# Patient Record
Sex: Female | Born: 1950
Health system: Southern US, Community
[De-identification: ages and names within clinical notes are randomized; demographics above are authoritative.]

## PROBLEM LIST (undated history)

## (undated) DIAGNOSIS — G43909 Migraine, unspecified, not intractable, without status migrainosus: Secondary | ICD-10-CM

## (undated) DIAGNOSIS — J449 Chronic obstructive pulmonary disease, unspecified: Secondary | ICD-10-CM

## (undated) DIAGNOSIS — D329 Benign neoplasm of meninges, unspecified: Secondary | ICD-10-CM

## (undated) DIAGNOSIS — K219 Gastro-esophageal reflux disease without esophagitis: Secondary | ICD-10-CM

## (undated) DIAGNOSIS — G459 Transient cerebral ischemic attack, unspecified: Secondary | ICD-10-CM

## (undated) DIAGNOSIS — M810 Age-related osteoporosis without current pathological fracture: Secondary | ICD-10-CM

## (undated) DIAGNOSIS — E119 Type 2 diabetes mellitus without complications: Secondary | ICD-10-CM

## (undated) DIAGNOSIS — I214 Non-ST elevation (NSTEMI) myocardial infarction: Secondary | ICD-10-CM

## (undated) DIAGNOSIS — I4891 Unspecified atrial fibrillation: Secondary | ICD-10-CM

## (undated) DIAGNOSIS — E785 Hyperlipidemia, unspecified: Secondary | ICD-10-CM

## (undated) DIAGNOSIS — F32A Depression, unspecified: Secondary | ICD-10-CM

## (undated) DIAGNOSIS — F418 Other specified anxiety disorders: Secondary | ICD-10-CM

## (undated) DIAGNOSIS — I219 Acute myocardial infarction, unspecified: Secondary | ICD-10-CM

## (undated) DIAGNOSIS — I2510 Atherosclerotic heart disease of native coronary artery without angina pectoris: Secondary | ICD-10-CM

## (undated) DIAGNOSIS — I10 Essential (primary) hypertension: Secondary | ICD-10-CM

## (undated) DIAGNOSIS — J45909 Unspecified asthma, uncomplicated: Secondary | ICD-10-CM

## (undated) DIAGNOSIS — F329 Major depressive disorder, single episode, unspecified: Secondary | ICD-10-CM

## (undated) DIAGNOSIS — I639 Cerebral infarction, unspecified: Secondary | ICD-10-CM

## (undated) HISTORY — DX: Other specified anxiety disorders: F41.8

## (undated) HISTORY — DX: Essential (primary) hypertension: I10

## (undated) HISTORY — DX: Gastro-esophageal reflux disease without esophagitis: K21.9

## (undated) HISTORY — DX: Atherosclerotic heart disease of native coronary artery without angina pectoris: I25.10

## (undated) HISTORY — DX: Transient cerebral ischemic attack, unspecified: G45.9

## (undated) HISTORY — PX: ABDOMINAL HYSTERECTOMY: SHX81

## (undated) HISTORY — DX: Chronic obstructive pulmonary disease, unspecified: J44.9

## (undated) HISTORY — DX: Unspecified asthma, uncomplicated: J45.909

## (undated) HISTORY — DX: Age-related osteoporosis without current pathological fracture: M81.0

## (undated) HISTORY — PX: CHOLECYSTECTOMY: SHX55

## (undated) HISTORY — DX: Cerebral infarction, unspecified: I63.9

## (undated) HISTORY — DX: Unspecified atrial fibrillation: I48.91

## (undated) HISTORY — DX: Benign neoplasm of meninges, unspecified: D32.9

## (undated) HISTORY — DX: Hyperlipidemia, unspecified: E78.5

## (undated) HISTORY — DX: Depression, unspecified: F32.A

## (undated) HISTORY — DX: Migraine, unspecified, not intractable, without status migrainosus: G43.909

## (undated) HISTORY — DX: Type 2 diabetes mellitus without complications: E11.9

## (undated) HISTORY — DX: Major depressive disorder, single episode, unspecified: F32.9

## (undated) HISTORY — DX: Non-ST elevation (NSTEMI) myocardial infarction: I21.4

## (undated) HISTORY — DX: Acute myocardial infarction, unspecified: I21.9

---

## 2013-10-08 DIAGNOSIS — R55 Syncope and collapse: Secondary | ICD-10-CM | POA: Insufficient documentation

## 2013-11-20 DIAGNOSIS — E785 Hyperlipidemia, unspecified: Secondary | ICD-10-CM | POA: Insufficient documentation

## 2013-11-20 DIAGNOSIS — J449 Chronic obstructive pulmonary disease, unspecified: Secondary | ICD-10-CM | POA: Insufficient documentation

## 2013-11-20 DIAGNOSIS — M1990 Unspecified osteoarthritis, unspecified site: Secondary | ICD-10-CM | POA: Insufficient documentation

## 2013-11-20 DIAGNOSIS — I639 Cerebral infarction, unspecified: Secondary | ICD-10-CM | POA: Insufficient documentation

## 2013-11-20 DIAGNOSIS — I10 Essential (primary) hypertension: Secondary | ICD-10-CM | POA: Insufficient documentation

## 2013-11-24 DIAGNOSIS — D320 Benign neoplasm of cerebral meninges: Secondary | ICD-10-CM | POA: Insufficient documentation

## 2014-10-01 DIAGNOSIS — D320 Benign neoplasm of cerebral meninges: Secondary | ICD-10-CM | POA: Diagnosis not present

## 2014-10-01 DIAGNOSIS — R51 Headache: Secondary | ICD-10-CM | POA: Diagnosis not present

## 2014-12-24 DIAGNOSIS — N201 Calculus of ureter: Secondary | ICD-10-CM | POA: Diagnosis not present

## 2014-12-24 DIAGNOSIS — N390 Urinary tract infection, site not specified: Secondary | ICD-10-CM | POA: Diagnosis not present

## 2014-12-24 DIAGNOSIS — N132 Hydronephrosis with renal and ureteral calculous obstruction: Secondary | ICD-10-CM | POA: Diagnosis not present

## 2015-02-15 DIAGNOSIS — I10 Essential (primary) hypertension: Secondary | ICD-10-CM | POA: Diagnosis not present

## 2015-02-15 DIAGNOSIS — J449 Chronic obstructive pulmonary disease, unspecified: Secondary | ICD-10-CM | POA: Diagnosis not present

## 2015-02-15 DIAGNOSIS — E1169 Type 2 diabetes mellitus with other specified complication: Secondary | ICD-10-CM | POA: Diagnosis not present

## 2015-02-15 DIAGNOSIS — Z955 Presence of coronary angioplasty implant and graft: Secondary | ICD-10-CM | POA: Diagnosis not present

## 2015-03-01 DIAGNOSIS — E1165 Type 2 diabetes mellitus with hyperglycemia: Secondary | ICD-10-CM | POA: Diagnosis not present

## 2015-03-01 DIAGNOSIS — Z79899 Other long term (current) drug therapy: Secondary | ICD-10-CM | POA: Diagnosis not present

## 2015-03-01 DIAGNOSIS — I10 Essential (primary) hypertension: Secondary | ICD-10-CM | POA: Diagnosis not present

## 2015-03-01 DIAGNOSIS — E559 Vitamin D deficiency, unspecified: Secondary | ICD-10-CM | POA: Diagnosis not present

## 2015-03-01 DIAGNOSIS — J449 Chronic obstructive pulmonary disease, unspecified: Secondary | ICD-10-CM | POA: Diagnosis not present

## 2015-03-01 DIAGNOSIS — E785 Hyperlipidemia, unspecified: Secondary | ICD-10-CM | POA: Diagnosis not present

## 2015-04-07 DIAGNOSIS — E1165 Type 2 diabetes mellitus with hyperglycemia: Secondary | ICD-10-CM | POA: Diagnosis not present

## 2015-04-07 DIAGNOSIS — Z6830 Body mass index (BMI) 30.0-30.9, adult: Secondary | ICD-10-CM | POA: Diagnosis not present

## 2015-05-04 DIAGNOSIS — H2513 Age-related nuclear cataract, bilateral: Secondary | ICD-10-CM | POA: Diagnosis not present

## 2015-05-04 DIAGNOSIS — E119 Type 2 diabetes mellitus without complications: Secondary | ICD-10-CM | POA: Diagnosis not present

## 2015-05-09 DIAGNOSIS — K219 Gastro-esophageal reflux disease without esophagitis: Secondary | ICD-10-CM | POA: Diagnosis not present

## 2015-05-09 DIAGNOSIS — J449 Chronic obstructive pulmonary disease, unspecified: Secondary | ICD-10-CM | POA: Diagnosis not present

## 2015-05-09 DIAGNOSIS — I2510 Atherosclerotic heart disease of native coronary artery without angina pectoris: Secondary | ICD-10-CM | POA: Diagnosis not present

## 2015-05-09 DIAGNOSIS — Z79899 Other long term (current) drug therapy: Secondary | ICD-10-CM | POA: Diagnosis not present

## 2015-05-09 DIAGNOSIS — I509 Heart failure, unspecified: Secondary | ICD-10-CM | POA: Diagnosis not present

## 2015-05-09 DIAGNOSIS — R404 Transient alteration of awareness: Secondary | ICD-10-CM | POA: Diagnosis not present

## 2015-05-09 DIAGNOSIS — Z794 Long term (current) use of insulin: Secondary | ICD-10-CM | POA: Diagnosis not present

## 2015-05-09 DIAGNOSIS — E7800 Pure hypercholesterolemia, unspecified: Secondary | ICD-10-CM | POA: Diagnosis not present

## 2015-05-09 DIAGNOSIS — Z7982 Long term (current) use of aspirin: Secondary | ICD-10-CM | POA: Diagnosis not present

## 2015-05-09 DIAGNOSIS — E119 Type 2 diabetes mellitus without complications: Secondary | ICD-10-CM | POA: Diagnosis not present

## 2015-05-09 DIAGNOSIS — J45909 Unspecified asthma, uncomplicated: Secondary | ICD-10-CM | POA: Diagnosis not present

## 2015-05-09 DIAGNOSIS — R42 Dizziness and giddiness: Secondary | ICD-10-CM | POA: Diagnosis not present

## 2015-05-09 DIAGNOSIS — R531 Weakness: Secondary | ICD-10-CM | POA: Diagnosis not present

## 2015-05-09 DIAGNOSIS — R55 Syncope and collapse: Secondary | ICD-10-CM | POA: Diagnosis not present

## 2015-05-09 DIAGNOSIS — I10 Essential (primary) hypertension: Secondary | ICD-10-CM | POA: Diagnosis not present

## 2015-05-09 DIAGNOSIS — Z8673 Personal history of transient ischemic attack (TIA), and cerebral infarction without residual deficits: Secondary | ICD-10-CM | POA: Diagnosis not present

## 2015-05-26 DIAGNOSIS — E663 Overweight: Secondary | ICD-10-CM | POA: Diagnosis not present

## 2015-05-26 DIAGNOSIS — E1165 Type 2 diabetes mellitus with hyperglycemia: Secondary | ICD-10-CM | POA: Diagnosis not present

## 2015-05-26 DIAGNOSIS — Z6830 Body mass index (BMI) 30.0-30.9, adult: Secondary | ICD-10-CM | POA: Diagnosis not present

## 2015-06-10 DIAGNOSIS — S61210A Laceration without foreign body of right index finger without damage to nail, initial encounter: Secondary | ICD-10-CM | POA: Diagnosis not present

## 2015-06-16 DIAGNOSIS — R112 Nausea with vomiting, unspecified: Secondary | ICD-10-CM | POA: Diagnosis not present

## 2015-06-16 DIAGNOSIS — E782 Mixed hyperlipidemia: Secondary | ICD-10-CM | POA: Diagnosis not present

## 2015-06-16 DIAGNOSIS — Z959 Presence of cardiac and vascular implant and graft, unspecified: Secondary | ICD-10-CM | POA: Diagnosis not present

## 2015-06-16 DIAGNOSIS — I2510 Atherosclerotic heart disease of native coronary artery without angina pectoris: Secondary | ICD-10-CM | POA: Diagnosis not present

## 2015-06-16 DIAGNOSIS — I252 Old myocardial infarction: Secondary | ICD-10-CM | POA: Diagnosis not present

## 2015-06-18 DIAGNOSIS — S61219A Laceration without foreign body of unspecified finger without damage to nail, initial encounter: Secondary | ICD-10-CM | POA: Diagnosis not present

## 2015-06-18 DIAGNOSIS — Z1389 Encounter for screening for other disorder: Secondary | ICD-10-CM | POA: Diagnosis not present

## 2015-06-18 DIAGNOSIS — Z6830 Body mass index (BMI) 30.0-30.9, adult: Secondary | ICD-10-CM | POA: Diagnosis not present

## 2015-06-18 DIAGNOSIS — T148 Other injury of unspecified body region: Secondary | ICD-10-CM | POA: Diagnosis not present

## 2015-06-18 DIAGNOSIS — Z23 Encounter for immunization: Secondary | ICD-10-CM | POA: Diagnosis not present

## 2015-07-07 DIAGNOSIS — J449 Chronic obstructive pulmonary disease, unspecified: Secondary | ICD-10-CM | POA: Diagnosis not present

## 2015-07-07 DIAGNOSIS — E1165 Type 2 diabetes mellitus with hyperglycemia: Secondary | ICD-10-CM | POA: Diagnosis not present

## 2015-07-07 DIAGNOSIS — I10 Essential (primary) hypertension: Secondary | ICD-10-CM | POA: Diagnosis not present

## 2015-07-07 DIAGNOSIS — E785 Hyperlipidemia, unspecified: Secondary | ICD-10-CM | POA: Diagnosis not present

## 2015-07-07 DIAGNOSIS — Z9181 History of falling: Secondary | ICD-10-CM | POA: Diagnosis not present

## 2015-08-06 DIAGNOSIS — M8589 Other specified disorders of bone density and structure, multiple sites: Secondary | ICD-10-CM | POA: Diagnosis not present

## 2015-08-06 DIAGNOSIS — Z1231 Encounter for screening mammogram for malignant neoplasm of breast: Secondary | ICD-10-CM | POA: Diagnosis not present

## 2015-08-06 DIAGNOSIS — Z780 Asymptomatic menopausal state: Secondary | ICD-10-CM | POA: Diagnosis not present

## 2015-10-05 DIAGNOSIS — J449 Chronic obstructive pulmonary disease, unspecified: Secondary | ICD-10-CM | POA: Diagnosis not present

## 2015-10-05 DIAGNOSIS — E785 Hyperlipidemia, unspecified: Secondary | ICD-10-CM | POA: Diagnosis not present

## 2015-10-05 DIAGNOSIS — I10 Essential (primary) hypertension: Secondary | ICD-10-CM | POA: Diagnosis not present

## 2015-10-05 DIAGNOSIS — E1165 Type 2 diabetes mellitus with hyperglycemia: Secondary | ICD-10-CM | POA: Diagnosis not present

## 2015-11-01 DIAGNOSIS — E1165 Type 2 diabetes mellitus with hyperglycemia: Secondary | ICD-10-CM | POA: Diagnosis not present

## 2015-11-03 DIAGNOSIS — E782 Mixed hyperlipidemia: Secondary | ICD-10-CM | POA: Diagnosis not present

## 2015-11-03 DIAGNOSIS — R1012 Left upper quadrant pain: Secondary | ICD-10-CM | POA: Diagnosis not present

## 2015-11-03 DIAGNOSIS — I2510 Atherosclerotic heart disease of native coronary artery without angina pectoris: Secondary | ICD-10-CM | POA: Diagnosis not present

## 2015-11-03 DIAGNOSIS — R0602 Shortness of breath: Secondary | ICD-10-CM | POA: Diagnosis not present

## 2015-11-03 DIAGNOSIS — R072 Precordial pain: Secondary | ICD-10-CM | POA: Diagnosis not present

## 2015-11-03 DIAGNOSIS — Z959 Presence of cardiac and vascular implant and graft, unspecified: Secondary | ICD-10-CM | POA: Diagnosis not present

## 2015-11-03 DIAGNOSIS — K2900 Acute gastritis without bleeding: Secondary | ICD-10-CM | POA: Diagnosis not present

## 2015-11-03 DIAGNOSIS — R1013 Epigastric pain: Secondary | ICD-10-CM | POA: Diagnosis not present

## 2015-11-03 DIAGNOSIS — I252 Old myocardial infarction: Secondary | ICD-10-CM | POA: Diagnosis not present

## 2015-11-11 DIAGNOSIS — Z959 Presence of cardiac and vascular implant and graft, unspecified: Secondary | ICD-10-CM | POA: Diagnosis not present

## 2015-11-11 DIAGNOSIS — I2510 Atherosclerotic heart disease of native coronary artery without angina pectoris: Secondary | ICD-10-CM | POA: Diagnosis not present

## 2015-11-11 DIAGNOSIS — R072 Precordial pain: Secondary | ICD-10-CM | POA: Diagnosis not present

## 2016-02-03 DIAGNOSIS — E785 Hyperlipidemia, unspecified: Secondary | ICD-10-CM | POA: Diagnosis not present

## 2016-02-03 DIAGNOSIS — E1165 Type 2 diabetes mellitus with hyperglycemia: Secondary | ICD-10-CM | POA: Diagnosis not present

## 2016-02-03 DIAGNOSIS — I10 Essential (primary) hypertension: Secondary | ICD-10-CM | POA: Diagnosis not present

## 2016-02-03 DIAGNOSIS — J449 Chronic obstructive pulmonary disease, unspecified: Secondary | ICD-10-CM | POA: Diagnosis not present

## 2016-03-08 DIAGNOSIS — I2510 Atherosclerotic heart disease of native coronary artery without angina pectoris: Secondary | ICD-10-CM | POA: Diagnosis not present

## 2016-03-08 DIAGNOSIS — I10 Essential (primary) hypertension: Secondary | ICD-10-CM | POA: Diagnosis not present

## 2016-03-08 DIAGNOSIS — I252 Old myocardial infarction: Secondary | ICD-10-CM | POA: Diagnosis not present

## 2016-03-08 DIAGNOSIS — J449 Chronic obstructive pulmonary disease, unspecified: Secondary | ICD-10-CM | POA: Diagnosis not present

## 2016-03-08 DIAGNOSIS — Z959 Presence of cardiac and vascular implant and graft, unspecified: Secondary | ICD-10-CM | POA: Diagnosis not present

## 2016-03-17 DIAGNOSIS — Z794 Long term (current) use of insulin: Secondary | ICD-10-CM | POA: Diagnosis not present

## 2016-03-17 DIAGNOSIS — IMO0001 Reserved for inherently not codable concepts without codable children: Secondary | ICD-10-CM | POA: Insufficient documentation

## 2016-03-17 DIAGNOSIS — E1165 Type 2 diabetes mellitus with hyperglycemia: Secondary | ICD-10-CM | POA: Diagnosis not present

## 2016-05-09 DIAGNOSIS — E119 Type 2 diabetes mellitus without complications: Secondary | ICD-10-CM | POA: Diagnosis not present

## 2016-05-09 DIAGNOSIS — H25013 Cortical age-related cataract, bilateral: Secondary | ICD-10-CM | POA: Diagnosis not present

## 2016-05-17 DIAGNOSIS — I10 Essential (primary) hypertension: Secondary | ICD-10-CM | POA: Diagnosis not present

## 2016-05-17 DIAGNOSIS — E1165 Type 2 diabetes mellitus with hyperglycemia: Secondary | ICD-10-CM | POA: Diagnosis not present

## 2016-05-17 DIAGNOSIS — Z794 Long term (current) use of insulin: Secondary | ICD-10-CM | POA: Diagnosis not present

## 2016-05-17 DIAGNOSIS — E782 Mixed hyperlipidemia: Secondary | ICD-10-CM | POA: Diagnosis not present

## 2016-06-06 DIAGNOSIS — Z23 Encounter for immunization: Secondary | ICD-10-CM | POA: Diagnosis not present

## 2016-06-06 DIAGNOSIS — I10 Essential (primary) hypertension: Secondary | ICD-10-CM | POA: Diagnosis not present

## 2016-06-06 DIAGNOSIS — E785 Hyperlipidemia, unspecified: Secondary | ICD-10-CM | POA: Diagnosis not present

## 2016-06-06 DIAGNOSIS — E1165 Type 2 diabetes mellitus with hyperglycemia: Secondary | ICD-10-CM | POA: Diagnosis not present

## 2016-06-06 DIAGNOSIS — J449 Chronic obstructive pulmonary disease, unspecified: Secondary | ICD-10-CM | POA: Diagnosis not present

## 2016-06-22 DIAGNOSIS — J449 Chronic obstructive pulmonary disease, unspecified: Secondary | ICD-10-CM | POA: Diagnosis not present

## 2016-06-22 DIAGNOSIS — I10 Essential (primary) hypertension: Secondary | ICD-10-CM | POA: Diagnosis not present

## 2016-06-22 DIAGNOSIS — I252 Old myocardial infarction: Secondary | ICD-10-CM | POA: Diagnosis not present

## 2016-06-22 DIAGNOSIS — I2510 Atherosclerotic heart disease of native coronary artery without angina pectoris: Secondary | ICD-10-CM | POA: Diagnosis not present

## 2016-06-22 DIAGNOSIS — Z959 Presence of cardiac and vascular implant and graft, unspecified: Secondary | ICD-10-CM | POA: Diagnosis not present

## 2016-08-17 DIAGNOSIS — I10 Essential (primary) hypertension: Secondary | ICD-10-CM | POA: Diagnosis not present

## 2016-08-17 DIAGNOSIS — E782 Mixed hyperlipidemia: Secondary | ICD-10-CM | POA: Diagnosis not present

## 2016-08-17 DIAGNOSIS — Z794 Long term (current) use of insulin: Secondary | ICD-10-CM | POA: Diagnosis not present

## 2016-08-17 DIAGNOSIS — E1165 Type 2 diabetes mellitus with hyperglycemia: Secondary | ICD-10-CM | POA: Diagnosis not present

## 2016-08-28 DIAGNOSIS — Z1231 Encounter for screening mammogram for malignant neoplasm of breast: Secondary | ICD-10-CM | POA: Diagnosis not present

## 2016-10-04 DIAGNOSIS — E1165 Type 2 diabetes mellitus with hyperglycemia: Secondary | ICD-10-CM | POA: Diagnosis not present

## 2016-10-04 DIAGNOSIS — E785 Hyperlipidemia, unspecified: Secondary | ICD-10-CM | POA: Diagnosis not present

## 2016-10-04 DIAGNOSIS — Z9181 History of falling: Secondary | ICD-10-CM | POA: Diagnosis not present

## 2016-10-04 DIAGNOSIS — Z1389 Encounter for screening for other disorder: Secondary | ICD-10-CM | POA: Diagnosis not present

## 2016-10-04 DIAGNOSIS — J449 Chronic obstructive pulmonary disease, unspecified: Secondary | ICD-10-CM | POA: Diagnosis not present

## 2016-10-04 DIAGNOSIS — I10 Essential (primary) hypertension: Secondary | ICD-10-CM | POA: Diagnosis not present

## 2016-11-20 DIAGNOSIS — Z794 Long term (current) use of insulin: Secondary | ICD-10-CM | POA: Diagnosis not present

## 2016-11-20 DIAGNOSIS — E1165 Type 2 diabetes mellitus with hyperglycemia: Secondary | ICD-10-CM | POA: Diagnosis not present

## 2016-11-20 DIAGNOSIS — I10 Essential (primary) hypertension: Secondary | ICD-10-CM | POA: Diagnosis not present

## 2016-11-20 DIAGNOSIS — E782 Mixed hyperlipidemia: Secondary | ICD-10-CM | POA: Diagnosis not present

## 2017-01-26 DIAGNOSIS — I2510 Atherosclerotic heart disease of native coronary artery without angina pectoris: Secondary | ICD-10-CM | POA: Diagnosis not present

## 2017-01-26 DIAGNOSIS — R0609 Other forms of dyspnea: Secondary | ICD-10-CM | POA: Diagnosis not present

## 2017-01-26 DIAGNOSIS — I252 Old myocardial infarction: Secondary | ICD-10-CM | POA: Diagnosis not present

## 2017-01-26 DIAGNOSIS — I10 Essential (primary) hypertension: Secondary | ICD-10-CM | POA: Diagnosis not present

## 2017-01-26 DIAGNOSIS — Z955 Presence of coronary angioplasty implant and graft: Secondary | ICD-10-CM | POA: Diagnosis not present

## 2017-01-31 DIAGNOSIS — I252 Old myocardial infarction: Secondary | ICD-10-CM | POA: Diagnosis not present

## 2017-01-31 DIAGNOSIS — R0609 Other forms of dyspnea: Secondary | ICD-10-CM | POA: Diagnosis not present

## 2017-02-13 DIAGNOSIS — E785 Hyperlipidemia, unspecified: Secondary | ICD-10-CM | POA: Diagnosis not present

## 2017-02-13 DIAGNOSIS — E1165 Type 2 diabetes mellitus with hyperglycemia: Secondary | ICD-10-CM | POA: Diagnosis not present

## 2017-02-13 DIAGNOSIS — I10 Essential (primary) hypertension: Secondary | ICD-10-CM | POA: Diagnosis not present

## 2017-02-13 DIAGNOSIS — Z9181 History of falling: Secondary | ICD-10-CM | POA: Diagnosis not present

## 2017-02-13 DIAGNOSIS — Z1389 Encounter for screening for other disorder: Secondary | ICD-10-CM | POA: Diagnosis not present

## 2017-02-13 DIAGNOSIS — J449 Chronic obstructive pulmonary disease, unspecified: Secondary | ICD-10-CM | POA: Diagnosis not present

## 2017-03-21 DIAGNOSIS — Z0000 Encounter for general adult medical examination without abnormal findings: Secondary | ICD-10-CM | POA: Diagnosis not present

## 2017-03-21 DIAGNOSIS — Z9181 History of falling: Secondary | ICD-10-CM | POA: Diagnosis not present

## 2017-03-21 DIAGNOSIS — Z23 Encounter for immunization: Secondary | ICD-10-CM | POA: Diagnosis not present

## 2017-03-21 DIAGNOSIS — Z1211 Encounter for screening for malignant neoplasm of colon: Secondary | ICD-10-CM | POA: Diagnosis not present

## 2017-03-21 DIAGNOSIS — E785 Hyperlipidemia, unspecified: Secondary | ICD-10-CM | POA: Diagnosis not present

## 2017-03-22 DIAGNOSIS — I2510 Atherosclerotic heart disease of native coronary artery without angina pectoris: Secondary | ICD-10-CM | POA: Diagnosis not present

## 2017-03-22 DIAGNOSIS — R29810 Facial weakness: Secondary | ICD-10-CM | POA: Diagnosis not present

## 2017-03-22 DIAGNOSIS — I69354 Hemiplegia and hemiparesis following cerebral infarction affecting left non-dominant side: Secondary | ICD-10-CM | POA: Diagnosis not present

## 2017-03-22 DIAGNOSIS — I252 Old myocardial infarction: Secondary | ICD-10-CM | POA: Diagnosis not present

## 2017-03-22 DIAGNOSIS — I214 Non-ST elevation (NSTEMI) myocardial infarction: Secondary | ICD-10-CM | POA: Insufficient documentation

## 2017-03-22 DIAGNOSIS — Z79899 Other long term (current) drug therapy: Secondary | ICD-10-CM | POA: Diagnosis not present

## 2017-03-22 DIAGNOSIS — E1165 Type 2 diabetes mellitus with hyperglycemia: Secondary | ICD-10-CM | POA: Diagnosis not present

## 2017-03-22 DIAGNOSIS — Z955 Presence of coronary angioplasty implant and graft: Secondary | ICD-10-CM | POA: Diagnosis not present

## 2017-03-22 DIAGNOSIS — I509 Heart failure, unspecified: Secondary | ICD-10-CM | POA: Diagnosis not present

## 2017-03-22 DIAGNOSIS — I110 Hypertensive heart disease with heart failure: Secondary | ICD-10-CM | POA: Diagnosis not present

## 2017-03-22 DIAGNOSIS — I4891 Unspecified atrial fibrillation: Secondary | ICD-10-CM | POA: Diagnosis not present

## 2017-03-22 DIAGNOSIS — R51 Headache: Secondary | ICD-10-CM | POA: Diagnosis not present

## 2017-03-22 DIAGNOSIS — E785 Hyperlipidemia, unspecified: Secondary | ICD-10-CM | POA: Diagnosis not present

## 2017-03-22 DIAGNOSIS — Z794 Long term (current) use of insulin: Secondary | ICD-10-CM | POA: Diagnosis not present

## 2017-03-22 DIAGNOSIS — I213 ST elevation (STEMI) myocardial infarction of unspecified site: Secondary | ICD-10-CM | POA: Diagnosis not present

## 2017-03-22 DIAGNOSIS — J449 Chronic obstructive pulmonary disease, unspecified: Secondary | ICD-10-CM | POA: Diagnosis not present

## 2017-03-22 DIAGNOSIS — I10 Essential (primary) hypertension: Secondary | ICD-10-CM | POA: Diagnosis not present

## 2017-03-22 DIAGNOSIS — E782 Mixed hyperlipidemia: Secondary | ICD-10-CM | POA: Diagnosis not present

## 2017-03-22 DIAGNOSIS — Z7982 Long term (current) use of aspirin: Secondary | ICD-10-CM | POA: Diagnosis not present

## 2017-03-22 DIAGNOSIS — G939 Disorder of brain, unspecified: Secondary | ICD-10-CM | POA: Diagnosis not present

## 2017-03-22 DIAGNOSIS — I480 Paroxysmal atrial fibrillation: Secondary | ICD-10-CM | POA: Diagnosis not present

## 2017-03-22 DIAGNOSIS — R079 Chest pain, unspecified: Secondary | ICD-10-CM | POA: Diagnosis not present

## 2017-03-22 DIAGNOSIS — K219 Gastro-esophageal reflux disease without esophagitis: Secondary | ICD-10-CM | POA: Diagnosis not present

## 2017-03-22 DIAGNOSIS — R0600 Dyspnea, unspecified: Secondary | ICD-10-CM | POA: Diagnosis not present

## 2017-03-22 DIAGNOSIS — I501 Left ventricular failure: Secondary | ICD-10-CM | POA: Diagnosis not present

## 2017-03-22 DIAGNOSIS — R569 Unspecified convulsions: Secondary | ICD-10-CM | POA: Diagnosis not present

## 2017-03-22 DIAGNOSIS — Z87891 Personal history of nicotine dependence: Secondary | ICD-10-CM | POA: Diagnosis not present

## 2017-03-22 DIAGNOSIS — R748 Abnormal levels of other serum enzymes: Secondary | ICD-10-CM | POA: Diagnosis not present

## 2017-03-22 DIAGNOSIS — R7989 Other specified abnormal findings of blood chemistry: Secondary | ICD-10-CM | POA: Diagnosis not present

## 2017-03-22 DIAGNOSIS — R55 Syncope and collapse: Secondary | ICD-10-CM | POA: Diagnosis not present

## 2017-03-22 DIAGNOSIS — R0602 Shortness of breath: Secondary | ICD-10-CM | POA: Diagnosis not present

## 2017-03-22 DIAGNOSIS — I25118 Atherosclerotic heart disease of native coronary artery with other forms of angina pectoris: Secondary | ICD-10-CM | POA: Diagnosis not present

## 2017-03-29 DIAGNOSIS — E162 Hypoglycemia, unspecified: Secondary | ICD-10-CM | POA: Diagnosis not present

## 2017-03-29 DIAGNOSIS — E161 Other hypoglycemia: Secondary | ICD-10-CM | POA: Diagnosis not present

## 2017-04-04 DIAGNOSIS — Z09 Encounter for follow-up examination after completed treatment for conditions other than malignant neoplasm: Secondary | ICD-10-CM | POA: Diagnosis not present

## 2017-04-04 DIAGNOSIS — I4891 Unspecified atrial fibrillation: Secondary | ICD-10-CM | POA: Diagnosis not present

## 2017-04-04 DIAGNOSIS — E1165 Type 2 diabetes mellitus with hyperglycemia: Secondary | ICD-10-CM | POA: Diagnosis not present

## 2017-04-04 DIAGNOSIS — I214 Non-ST elevation (NSTEMI) myocardial infarction: Secondary | ICD-10-CM | POA: Diagnosis not present

## 2017-04-04 DIAGNOSIS — Z79899 Other long term (current) drug therapy: Secondary | ICD-10-CM | POA: Diagnosis not present

## 2017-04-24 DIAGNOSIS — E782 Mixed hyperlipidemia: Secondary | ICD-10-CM | POA: Diagnosis not present

## 2017-04-24 DIAGNOSIS — R319 Hematuria, unspecified: Secondary | ICD-10-CM | POA: Diagnosis not present

## 2017-04-24 DIAGNOSIS — I10 Essential (primary) hypertension: Secondary | ICD-10-CM | POA: Diagnosis not present

## 2017-04-24 DIAGNOSIS — I2510 Atherosclerotic heart disease of native coronary artery without angina pectoris: Secondary | ICD-10-CM | POA: Diagnosis not present

## 2017-04-24 DIAGNOSIS — I480 Paroxysmal atrial fibrillation: Secondary | ICD-10-CM | POA: Diagnosis not present

## 2017-04-27 DIAGNOSIS — R319 Hematuria, unspecified: Secondary | ICD-10-CM | POA: Diagnosis not present

## 2017-04-27 DIAGNOSIS — R1030 Lower abdominal pain, unspecified: Secondary | ICD-10-CM | POA: Diagnosis not present

## 2017-04-27 DIAGNOSIS — N3001 Acute cystitis with hematuria: Secondary | ICD-10-CM | POA: Diagnosis not present

## 2017-05-01 DIAGNOSIS — N3090 Cystitis, unspecified without hematuria: Secondary | ICD-10-CM | POA: Diagnosis not present

## 2017-05-01 DIAGNOSIS — R310 Gross hematuria: Secondary | ICD-10-CM | POA: Diagnosis not present

## 2017-05-01 DIAGNOSIS — N3001 Acute cystitis with hematuria: Secondary | ICD-10-CM | POA: Diagnosis not present

## 2017-05-01 DIAGNOSIS — N318 Other neuromuscular dysfunction of bladder: Secondary | ICD-10-CM | POA: Diagnosis not present

## 2017-05-03 DIAGNOSIS — D329 Benign neoplasm of meninges, unspecified: Secondary | ICD-10-CM | POA: Diagnosis not present

## 2017-05-03 DIAGNOSIS — R944 Abnormal results of kidney function studies: Secondary | ICD-10-CM | POA: Diagnosis not present

## 2017-05-03 DIAGNOSIS — G43909 Migraine, unspecified, not intractable, without status migrainosus: Secondary | ICD-10-CM | POA: Diagnosis not present

## 2017-05-07 DIAGNOSIS — H524 Presbyopia: Secondary | ICD-10-CM | POA: Diagnosis not present

## 2017-05-07 DIAGNOSIS — E119 Type 2 diabetes mellitus without complications: Secondary | ICD-10-CM | POA: Diagnosis not present

## 2017-05-16 DIAGNOSIS — D329 Benign neoplasm of meninges, unspecified: Secondary | ICD-10-CM | POA: Diagnosis not present

## 2017-05-17 DIAGNOSIS — I10 Essential (primary) hypertension: Secondary | ICD-10-CM | POA: Diagnosis not present

## 2017-05-17 DIAGNOSIS — R310 Gross hematuria: Secondary | ICD-10-CM | POA: Diagnosis not present

## 2017-05-17 DIAGNOSIS — I4891 Unspecified atrial fibrillation: Secondary | ICD-10-CM | POA: Diagnosis not present

## 2017-05-17 DIAGNOSIS — I214 Non-ST elevation (NSTEMI) myocardial infarction: Secondary | ICD-10-CM | POA: Diagnosis not present

## 2017-06-18 DIAGNOSIS — Z23 Encounter for immunization: Secondary | ICD-10-CM | POA: Diagnosis not present

## 2017-06-18 DIAGNOSIS — J449 Chronic obstructive pulmonary disease, unspecified: Secondary | ICD-10-CM | POA: Diagnosis not present

## 2017-06-18 DIAGNOSIS — I214 Non-ST elevation (NSTEMI) myocardial infarction: Secondary | ICD-10-CM | POA: Diagnosis not present

## 2017-06-18 DIAGNOSIS — I10 Essential (primary) hypertension: Secondary | ICD-10-CM | POA: Diagnosis not present

## 2017-06-18 DIAGNOSIS — E1165 Type 2 diabetes mellitus with hyperglycemia: Secondary | ICD-10-CM | POA: Diagnosis not present

## 2017-06-18 DIAGNOSIS — E785 Hyperlipidemia, unspecified: Secondary | ICD-10-CM | POA: Diagnosis not present

## 2017-07-25 DIAGNOSIS — I480 Paroxysmal atrial fibrillation: Secondary | ICD-10-CM | POA: Diagnosis not present

## 2017-07-25 DIAGNOSIS — Z955 Presence of coronary angioplasty implant and graft: Secondary | ICD-10-CM | POA: Diagnosis not present

## 2017-07-25 DIAGNOSIS — I10 Essential (primary) hypertension: Secondary | ICD-10-CM | POA: Diagnosis not present

## 2017-07-25 DIAGNOSIS — I252 Old myocardial infarction: Secondary | ICD-10-CM | POA: Diagnosis not present

## 2017-07-25 DIAGNOSIS — I2510 Atherosclerotic heart disease of native coronary artery without angina pectoris: Secondary | ICD-10-CM | POA: Diagnosis not present

## 2017-08-20 DIAGNOSIS — J449 Chronic obstructive pulmonary disease, unspecified: Secondary | ICD-10-CM | POA: Diagnosis not present

## 2017-08-20 DIAGNOSIS — I4891 Unspecified atrial fibrillation: Secondary | ICD-10-CM | POA: Diagnosis not present

## 2017-08-20 DIAGNOSIS — I10 Essential (primary) hypertension: Secondary | ICD-10-CM | POA: Diagnosis not present

## 2017-08-20 DIAGNOSIS — E785 Hyperlipidemia, unspecified: Secondary | ICD-10-CM | POA: Diagnosis not present

## 2017-08-20 DIAGNOSIS — E1165 Type 2 diabetes mellitus with hyperglycemia: Secondary | ICD-10-CM | POA: Diagnosis not present

## 2017-08-20 DIAGNOSIS — I214 Non-ST elevation (NSTEMI) myocardial infarction: Secondary | ICD-10-CM | POA: Diagnosis not present

## 2017-11-01 DIAGNOSIS — G43909 Migraine, unspecified, not intractable, without status migrainosus: Secondary | ICD-10-CM | POA: Diagnosis not present

## 2017-11-01 DIAGNOSIS — D329 Benign neoplasm of meninges, unspecified: Secondary | ICD-10-CM | POA: Diagnosis not present

## 2017-11-24 DIAGNOSIS — R51 Headache: Secondary | ICD-10-CM | POA: Diagnosis not present

## 2017-11-24 DIAGNOSIS — R001 Bradycardia, unspecified: Secondary | ICD-10-CM | POA: Diagnosis not present

## 2017-11-24 DIAGNOSIS — I10 Essential (primary) hypertension: Secondary | ICD-10-CM | POA: Diagnosis not present

## 2017-11-24 DIAGNOSIS — I469 Cardiac arrest, cause unspecified: Secondary | ICD-10-CM | POA: Diagnosis not present

## 2017-11-24 DIAGNOSIS — R42 Dizziness and giddiness: Secondary | ICD-10-CM | POA: Diagnosis not present

## 2017-11-24 DIAGNOSIS — N179 Acute kidney failure, unspecified: Secondary | ICD-10-CM | POA: Diagnosis not present

## 2017-11-24 DIAGNOSIS — R0600 Dyspnea, unspecified: Secondary | ICD-10-CM | POA: Diagnosis not present

## 2017-11-24 DIAGNOSIS — I160 Hypertensive urgency: Secondary | ICD-10-CM | POA: Diagnosis not present

## 2017-11-25 DIAGNOSIS — R001 Bradycardia, unspecified: Secondary | ICD-10-CM | POA: Diagnosis not present

## 2017-11-25 DIAGNOSIS — R000 Tachycardia, unspecified: Secondary | ICD-10-CM | POA: Diagnosis not present

## 2017-11-25 DIAGNOSIS — I469 Cardiac arrest, cause unspecified: Secondary | ICD-10-CM | POA: Diagnosis not present

## 2017-11-25 DIAGNOSIS — I361 Nonrheumatic tricuspid (valve) insufficiency: Secondary | ICD-10-CM | POA: Diagnosis not present

## 2017-11-25 DIAGNOSIS — I10 Essential (primary) hypertension: Secondary | ICD-10-CM | POA: Diagnosis not present

## 2017-11-25 DIAGNOSIS — R51 Headache: Secondary | ICD-10-CM | POA: Diagnosis not present

## 2017-11-25 DIAGNOSIS — I2510 Atherosclerotic heart disease of native coronary artery without angina pectoris: Secondary | ICD-10-CM | POA: Diagnosis not present

## 2017-11-25 DIAGNOSIS — N179 Acute kidney failure, unspecified: Secondary | ICD-10-CM | POA: Diagnosis not present

## 2017-11-25 DIAGNOSIS — I160 Hypertensive urgency: Secondary | ICD-10-CM | POA: Diagnosis not present

## 2017-11-26 DIAGNOSIS — N179 Acute kidney failure, unspecified: Secondary | ICD-10-CM | POA: Diagnosis not present

## 2017-11-26 DIAGNOSIS — Z955 Presence of coronary angioplasty implant and graft: Secondary | ICD-10-CM | POA: Diagnosis not present

## 2017-11-26 DIAGNOSIS — R4781 Slurred speech: Secondary | ICD-10-CM | POA: Diagnosis not present

## 2017-11-26 DIAGNOSIS — K219 Gastro-esophageal reflux disease without esophagitis: Secondary | ICD-10-CM | POA: Diagnosis not present

## 2017-11-26 DIAGNOSIS — D320 Benign neoplasm of cerebral meninges: Secondary | ICD-10-CM | POA: Diagnosis not present

## 2017-11-26 DIAGNOSIS — Z7984 Long term (current) use of oral hypoglycemic drugs: Secondary | ICD-10-CM | POA: Diagnosis not present

## 2017-11-26 DIAGNOSIS — R51 Headache: Secondary | ICD-10-CM | POA: Diagnosis not present

## 2017-11-26 DIAGNOSIS — E119 Type 2 diabetes mellitus without complications: Secondary | ICD-10-CM | POA: Diagnosis not present

## 2017-11-26 DIAGNOSIS — I160 Hypertensive urgency: Secondary | ICD-10-CM | POA: Diagnosis not present

## 2017-11-26 DIAGNOSIS — R000 Tachycardia, unspecified: Secondary | ICD-10-CM | POA: Diagnosis not present

## 2017-11-26 DIAGNOSIS — I6523 Occlusion and stenosis of bilateral carotid arteries: Secondary | ICD-10-CM | POA: Diagnosis not present

## 2017-11-26 DIAGNOSIS — Z794 Long term (current) use of insulin: Secondary | ICD-10-CM | POA: Diagnosis not present

## 2017-11-26 DIAGNOSIS — Z7901 Long term (current) use of anticoagulants: Secondary | ICD-10-CM | POA: Diagnosis not present

## 2017-11-26 DIAGNOSIS — I10 Essential (primary) hypertension: Secondary | ICD-10-CM | POA: Diagnosis not present

## 2017-11-26 DIAGNOSIS — I469 Cardiac arrest, cause unspecified: Secondary | ICD-10-CM | POA: Diagnosis not present

## 2017-11-26 DIAGNOSIS — M545 Low back pain: Secondary | ICD-10-CM | POA: Diagnosis not present

## 2017-11-26 DIAGNOSIS — Z8673 Personal history of transient ischemic attack (TIA), and cerebral infarction without residual deficits: Secondary | ICD-10-CM | POA: Diagnosis not present

## 2017-11-26 DIAGNOSIS — R001 Bradycardia, unspecified: Secondary | ICD-10-CM | POA: Diagnosis not present

## 2017-11-26 DIAGNOSIS — Z79899 Other long term (current) drug therapy: Secondary | ICD-10-CM | POA: Diagnosis not present

## 2017-11-26 DIAGNOSIS — I480 Paroxysmal atrial fibrillation: Secondary | ICD-10-CM | POA: Diagnosis not present

## 2017-11-26 DIAGNOSIS — I2510 Atherosclerotic heart disease of native coronary artery without angina pectoris: Secondary | ICD-10-CM | POA: Diagnosis not present

## 2017-11-26 DIAGNOSIS — I361 Nonrheumatic tricuspid (valve) insufficiency: Secondary | ICD-10-CM

## 2017-11-30 DIAGNOSIS — I110 Hypertensive heart disease with heart failure: Secondary | ICD-10-CM | POA: Diagnosis not present

## 2017-11-30 DIAGNOSIS — I480 Paroxysmal atrial fibrillation: Secondary | ICD-10-CM | POA: Diagnosis not present

## 2017-11-30 DIAGNOSIS — I2510 Atherosclerotic heart disease of native coronary artery without angina pectoris: Secondary | ICD-10-CM | POA: Diagnosis not present

## 2017-11-30 DIAGNOSIS — J439 Emphysema, unspecified: Secondary | ICD-10-CM | POA: Diagnosis not present

## 2017-11-30 DIAGNOSIS — Z9981 Dependence on supplemental oxygen: Secondary | ICD-10-CM | POA: Diagnosis not present

## 2017-11-30 DIAGNOSIS — Z794 Long term (current) use of insulin: Secondary | ICD-10-CM | POA: Diagnosis not present

## 2017-11-30 DIAGNOSIS — Z7901 Long term (current) use of anticoagulants: Secondary | ICD-10-CM | POA: Diagnosis not present

## 2017-11-30 DIAGNOSIS — Z7951 Long term (current) use of inhaled steroids: Secondary | ICD-10-CM | POA: Diagnosis not present

## 2017-11-30 DIAGNOSIS — Z8673 Personal history of transient ischemic attack (TIA), and cerebral infarction without residual deficits: Secondary | ICD-10-CM | POA: Diagnosis not present

## 2017-11-30 DIAGNOSIS — E119 Type 2 diabetes mellitus without complications: Secondary | ICD-10-CM | POA: Diagnosis not present

## 2017-11-30 DIAGNOSIS — Z95828 Presence of other vascular implants and grafts: Secondary | ICD-10-CM | POA: Diagnosis not present

## 2017-11-30 DIAGNOSIS — I509 Heart failure, unspecified: Secondary | ICD-10-CM | POA: Diagnosis not present

## 2017-12-03 DIAGNOSIS — I480 Paroxysmal atrial fibrillation: Secondary | ICD-10-CM | POA: Diagnosis not present

## 2017-12-03 DIAGNOSIS — I110 Hypertensive heart disease with heart failure: Secondary | ICD-10-CM | POA: Diagnosis not present

## 2017-12-03 DIAGNOSIS — I2510 Atherosclerotic heart disease of native coronary artery without angina pectoris: Secondary | ICD-10-CM | POA: Diagnosis not present

## 2017-12-03 DIAGNOSIS — I509 Heart failure, unspecified: Secondary | ICD-10-CM | POA: Diagnosis not present

## 2017-12-03 DIAGNOSIS — Z7951 Long term (current) use of inhaled steroids: Secondary | ICD-10-CM | POA: Diagnosis not present

## 2017-12-03 DIAGNOSIS — Z7901 Long term (current) use of anticoagulants: Secondary | ICD-10-CM | POA: Diagnosis not present

## 2017-12-03 DIAGNOSIS — J439 Emphysema, unspecified: Secondary | ICD-10-CM | POA: Diagnosis not present

## 2017-12-03 DIAGNOSIS — Z8673 Personal history of transient ischemic attack (TIA), and cerebral infarction without residual deficits: Secondary | ICD-10-CM | POA: Diagnosis not present

## 2017-12-03 DIAGNOSIS — Z9981 Dependence on supplemental oxygen: Secondary | ICD-10-CM | POA: Diagnosis not present

## 2017-12-03 DIAGNOSIS — Z794 Long term (current) use of insulin: Secondary | ICD-10-CM | POA: Diagnosis not present

## 2017-12-03 DIAGNOSIS — Z95828 Presence of other vascular implants and grafts: Secondary | ICD-10-CM | POA: Diagnosis not present

## 2017-12-03 DIAGNOSIS — E119 Type 2 diabetes mellitus without complications: Secondary | ICD-10-CM | POA: Diagnosis not present

## 2017-12-04 DIAGNOSIS — E785 Hyperlipidemia, unspecified: Secondary | ICD-10-CM | POA: Diagnosis not present

## 2017-12-04 DIAGNOSIS — G629 Polyneuropathy, unspecified: Secondary | ICD-10-CM | POA: Diagnosis not present

## 2017-12-04 DIAGNOSIS — I10 Essential (primary) hypertension: Secondary | ICD-10-CM | POA: Diagnosis not present

## 2017-12-04 DIAGNOSIS — E1165 Type 2 diabetes mellitus with hyperglycemia: Secondary | ICD-10-CM | POA: Diagnosis not present

## 2017-12-04 DIAGNOSIS — J449 Chronic obstructive pulmonary disease, unspecified: Secondary | ICD-10-CM | POA: Diagnosis not present

## 2017-12-06 DIAGNOSIS — Z7951 Long term (current) use of inhaled steroids: Secondary | ICD-10-CM | POA: Diagnosis not present

## 2017-12-06 DIAGNOSIS — J439 Emphysema, unspecified: Secondary | ICD-10-CM | POA: Diagnosis not present

## 2017-12-06 DIAGNOSIS — Z794 Long term (current) use of insulin: Secondary | ICD-10-CM | POA: Diagnosis not present

## 2017-12-06 DIAGNOSIS — I480 Paroxysmal atrial fibrillation: Secondary | ICD-10-CM | POA: Diagnosis not present

## 2017-12-06 DIAGNOSIS — E119 Type 2 diabetes mellitus without complications: Secondary | ICD-10-CM | POA: Diagnosis not present

## 2017-12-06 DIAGNOSIS — I2510 Atherosclerotic heart disease of native coronary artery without angina pectoris: Secondary | ICD-10-CM | POA: Diagnosis not present

## 2017-12-06 DIAGNOSIS — I509 Heart failure, unspecified: Secondary | ICD-10-CM | POA: Diagnosis not present

## 2017-12-06 DIAGNOSIS — Z95828 Presence of other vascular implants and grafts: Secondary | ICD-10-CM | POA: Diagnosis not present

## 2017-12-06 DIAGNOSIS — Z9981 Dependence on supplemental oxygen: Secondary | ICD-10-CM | POA: Diagnosis not present

## 2017-12-06 DIAGNOSIS — Z7901 Long term (current) use of anticoagulants: Secondary | ICD-10-CM | POA: Diagnosis not present

## 2017-12-06 DIAGNOSIS — I110 Hypertensive heart disease with heart failure: Secondary | ICD-10-CM | POA: Diagnosis not present

## 2017-12-06 DIAGNOSIS — Z8673 Personal history of transient ischemic attack (TIA), and cerebral infarction without residual deficits: Secondary | ICD-10-CM | POA: Diagnosis not present

## 2017-12-07 DIAGNOSIS — I2510 Atherosclerotic heart disease of native coronary artery without angina pectoris: Secondary | ICD-10-CM | POA: Diagnosis not present

## 2017-12-07 DIAGNOSIS — Z7951 Long term (current) use of inhaled steroids: Secondary | ICD-10-CM | POA: Diagnosis not present

## 2017-12-07 DIAGNOSIS — Z7901 Long term (current) use of anticoagulants: Secondary | ICD-10-CM | POA: Diagnosis not present

## 2017-12-07 DIAGNOSIS — I110 Hypertensive heart disease with heart failure: Secondary | ICD-10-CM | POA: Diagnosis not present

## 2017-12-07 DIAGNOSIS — E119 Type 2 diabetes mellitus without complications: Secondary | ICD-10-CM | POA: Diagnosis not present

## 2017-12-07 DIAGNOSIS — J439 Emphysema, unspecified: Secondary | ICD-10-CM | POA: Diagnosis not present

## 2017-12-07 DIAGNOSIS — Z95828 Presence of other vascular implants and grafts: Secondary | ICD-10-CM | POA: Diagnosis not present

## 2017-12-07 DIAGNOSIS — Z8673 Personal history of transient ischemic attack (TIA), and cerebral infarction without residual deficits: Secondary | ICD-10-CM | POA: Diagnosis not present

## 2017-12-07 DIAGNOSIS — I509 Heart failure, unspecified: Secondary | ICD-10-CM | POA: Diagnosis not present

## 2017-12-07 DIAGNOSIS — Z794 Long term (current) use of insulin: Secondary | ICD-10-CM | POA: Diagnosis not present

## 2017-12-07 DIAGNOSIS — Z9981 Dependence on supplemental oxygen: Secondary | ICD-10-CM | POA: Diagnosis not present

## 2017-12-07 DIAGNOSIS — I480 Paroxysmal atrial fibrillation: Secondary | ICD-10-CM | POA: Diagnosis not present

## 2017-12-10 DIAGNOSIS — I110 Hypertensive heart disease with heart failure: Secondary | ICD-10-CM | POA: Diagnosis not present

## 2017-12-10 DIAGNOSIS — I480 Paroxysmal atrial fibrillation: Secondary | ICD-10-CM | POA: Diagnosis not present

## 2017-12-10 DIAGNOSIS — Z8673 Personal history of transient ischemic attack (TIA), and cerebral infarction without residual deficits: Secondary | ICD-10-CM | POA: Diagnosis not present

## 2017-12-10 DIAGNOSIS — Z95828 Presence of other vascular implants and grafts: Secondary | ICD-10-CM | POA: Diagnosis not present

## 2017-12-10 DIAGNOSIS — Z794 Long term (current) use of insulin: Secondary | ICD-10-CM | POA: Diagnosis not present

## 2017-12-10 DIAGNOSIS — Z9981 Dependence on supplemental oxygen: Secondary | ICD-10-CM | POA: Diagnosis not present

## 2017-12-10 DIAGNOSIS — Z7901 Long term (current) use of anticoagulants: Secondary | ICD-10-CM | POA: Diagnosis not present

## 2017-12-10 DIAGNOSIS — I2510 Atherosclerotic heart disease of native coronary artery without angina pectoris: Secondary | ICD-10-CM | POA: Diagnosis not present

## 2017-12-10 DIAGNOSIS — J439 Emphysema, unspecified: Secondary | ICD-10-CM | POA: Diagnosis not present

## 2017-12-10 DIAGNOSIS — E119 Type 2 diabetes mellitus without complications: Secondary | ICD-10-CM | POA: Diagnosis not present

## 2017-12-10 DIAGNOSIS — Z7951 Long term (current) use of inhaled steroids: Secondary | ICD-10-CM | POA: Diagnosis not present

## 2017-12-10 DIAGNOSIS — I509 Heart failure, unspecified: Secondary | ICD-10-CM | POA: Diagnosis not present

## 2017-12-13 DIAGNOSIS — I509 Heart failure, unspecified: Secondary | ICD-10-CM | POA: Diagnosis not present

## 2017-12-13 DIAGNOSIS — Z95828 Presence of other vascular implants and grafts: Secondary | ICD-10-CM | POA: Diagnosis not present

## 2017-12-13 DIAGNOSIS — Z9981 Dependence on supplemental oxygen: Secondary | ICD-10-CM | POA: Diagnosis not present

## 2017-12-13 DIAGNOSIS — Z8673 Personal history of transient ischemic attack (TIA), and cerebral infarction without residual deficits: Secondary | ICD-10-CM | POA: Diagnosis not present

## 2017-12-13 DIAGNOSIS — I110 Hypertensive heart disease with heart failure: Secondary | ICD-10-CM | POA: Diagnosis not present

## 2017-12-13 DIAGNOSIS — I2510 Atherosclerotic heart disease of native coronary artery without angina pectoris: Secondary | ICD-10-CM | POA: Diagnosis not present

## 2017-12-13 DIAGNOSIS — Z794 Long term (current) use of insulin: Secondary | ICD-10-CM | POA: Diagnosis not present

## 2017-12-13 DIAGNOSIS — E119 Type 2 diabetes mellitus without complications: Secondary | ICD-10-CM | POA: Diagnosis not present

## 2017-12-13 DIAGNOSIS — J439 Emphysema, unspecified: Secondary | ICD-10-CM | POA: Diagnosis not present

## 2017-12-13 DIAGNOSIS — Z7951 Long term (current) use of inhaled steroids: Secondary | ICD-10-CM | POA: Diagnosis not present

## 2017-12-13 DIAGNOSIS — I480 Paroxysmal atrial fibrillation: Secondary | ICD-10-CM | POA: Diagnosis not present

## 2017-12-13 DIAGNOSIS — Z7901 Long term (current) use of anticoagulants: Secondary | ICD-10-CM | POA: Diagnosis not present

## 2017-12-18 DIAGNOSIS — E119 Type 2 diabetes mellitus without complications: Secondary | ICD-10-CM | POA: Diagnosis not present

## 2017-12-18 DIAGNOSIS — Z7901 Long term (current) use of anticoagulants: Secondary | ICD-10-CM | POA: Diagnosis not present

## 2017-12-18 DIAGNOSIS — Z794 Long term (current) use of insulin: Secondary | ICD-10-CM | POA: Diagnosis not present

## 2017-12-18 DIAGNOSIS — Z9981 Dependence on supplemental oxygen: Secondary | ICD-10-CM | POA: Diagnosis not present

## 2017-12-18 DIAGNOSIS — I509 Heart failure, unspecified: Secondary | ICD-10-CM | POA: Diagnosis not present

## 2017-12-18 DIAGNOSIS — Z7951 Long term (current) use of inhaled steroids: Secondary | ICD-10-CM | POA: Diagnosis not present

## 2017-12-18 DIAGNOSIS — J439 Emphysema, unspecified: Secondary | ICD-10-CM | POA: Diagnosis not present

## 2017-12-18 DIAGNOSIS — I2510 Atherosclerotic heart disease of native coronary artery without angina pectoris: Secondary | ICD-10-CM | POA: Diagnosis not present

## 2017-12-18 DIAGNOSIS — Z95828 Presence of other vascular implants and grafts: Secondary | ICD-10-CM | POA: Diagnosis not present

## 2017-12-18 DIAGNOSIS — I480 Paroxysmal atrial fibrillation: Secondary | ICD-10-CM | POA: Diagnosis not present

## 2017-12-18 DIAGNOSIS — Z8673 Personal history of transient ischemic attack (TIA), and cerebral infarction without residual deficits: Secondary | ICD-10-CM | POA: Diagnosis not present

## 2017-12-18 DIAGNOSIS — I110 Hypertensive heart disease with heart failure: Secondary | ICD-10-CM | POA: Diagnosis not present

## 2017-12-20 DIAGNOSIS — E119 Type 2 diabetes mellitus without complications: Secondary | ICD-10-CM | POA: Diagnosis not present

## 2017-12-20 DIAGNOSIS — Z9981 Dependence on supplemental oxygen: Secondary | ICD-10-CM | POA: Diagnosis not present

## 2017-12-20 DIAGNOSIS — I480 Paroxysmal atrial fibrillation: Secondary | ICD-10-CM | POA: Diagnosis not present

## 2017-12-20 DIAGNOSIS — I509 Heart failure, unspecified: Secondary | ICD-10-CM | POA: Diagnosis not present

## 2017-12-20 DIAGNOSIS — Z7901 Long term (current) use of anticoagulants: Secondary | ICD-10-CM | POA: Diagnosis not present

## 2017-12-20 DIAGNOSIS — J439 Emphysema, unspecified: Secondary | ICD-10-CM | POA: Diagnosis not present

## 2017-12-20 DIAGNOSIS — Z794 Long term (current) use of insulin: Secondary | ICD-10-CM | POA: Diagnosis not present

## 2017-12-20 DIAGNOSIS — I2510 Atherosclerotic heart disease of native coronary artery without angina pectoris: Secondary | ICD-10-CM | POA: Diagnosis not present

## 2017-12-20 DIAGNOSIS — I110 Hypertensive heart disease with heart failure: Secondary | ICD-10-CM | POA: Diagnosis not present

## 2017-12-20 DIAGNOSIS — Z8673 Personal history of transient ischemic attack (TIA), and cerebral infarction without residual deficits: Secondary | ICD-10-CM | POA: Diagnosis not present

## 2017-12-20 DIAGNOSIS — Z95828 Presence of other vascular implants and grafts: Secondary | ICD-10-CM | POA: Diagnosis not present

## 2017-12-20 DIAGNOSIS — Z7951 Long term (current) use of inhaled steroids: Secondary | ICD-10-CM | POA: Diagnosis not present

## 2017-12-25 DIAGNOSIS — Z7951 Long term (current) use of inhaled steroids: Secondary | ICD-10-CM | POA: Diagnosis not present

## 2017-12-25 DIAGNOSIS — Z9981 Dependence on supplemental oxygen: Secondary | ICD-10-CM | POA: Diagnosis not present

## 2017-12-25 DIAGNOSIS — R001 Bradycardia, unspecified: Secondary | ICD-10-CM | POA: Diagnosis not present

## 2017-12-25 DIAGNOSIS — Z7901 Long term (current) use of anticoagulants: Secondary | ICD-10-CM | POA: Diagnosis not present

## 2017-12-25 DIAGNOSIS — Z8674 Personal history of sudden cardiac arrest: Secondary | ICD-10-CM | POA: Diagnosis not present

## 2017-12-25 DIAGNOSIS — Z794 Long term (current) use of insulin: Secondary | ICD-10-CM | POA: Diagnosis not present

## 2017-12-25 DIAGNOSIS — I2510 Atherosclerotic heart disease of native coronary artery without angina pectoris: Secondary | ICD-10-CM | POA: Diagnosis not present

## 2017-12-25 DIAGNOSIS — I110 Hypertensive heart disease with heart failure: Secondary | ICD-10-CM | POA: Diagnosis not present

## 2017-12-25 DIAGNOSIS — J439 Emphysema, unspecified: Secondary | ICD-10-CM | POA: Diagnosis not present

## 2017-12-25 DIAGNOSIS — Z95828 Presence of other vascular implants and grafts: Secondary | ICD-10-CM | POA: Diagnosis not present

## 2017-12-25 DIAGNOSIS — E119 Type 2 diabetes mellitus without complications: Secondary | ICD-10-CM | POA: Diagnosis not present

## 2017-12-25 DIAGNOSIS — N179 Acute kidney failure, unspecified: Secondary | ICD-10-CM | POA: Diagnosis not present

## 2017-12-25 DIAGNOSIS — I509 Heart failure, unspecified: Secondary | ICD-10-CM | POA: Diagnosis not present

## 2017-12-25 DIAGNOSIS — Z8673 Personal history of transient ischemic attack (TIA), and cerebral infarction without residual deficits: Secondary | ICD-10-CM | POA: Diagnosis not present

## 2017-12-25 DIAGNOSIS — I480 Paroxysmal atrial fibrillation: Secondary | ICD-10-CM | POA: Diagnosis not present

## 2017-12-29 DIAGNOSIS — I480 Paroxysmal atrial fibrillation: Secondary | ICD-10-CM | POA: Diagnosis not present

## 2017-12-29 DIAGNOSIS — K219 Gastro-esophageal reflux disease without esophagitis: Secondary | ICD-10-CM | POA: Diagnosis not present

## 2017-12-29 DIAGNOSIS — R001 Bradycardia, unspecified: Secondary | ICD-10-CM | POA: Diagnosis not present

## 2017-12-29 DIAGNOSIS — Z7984 Long term (current) use of oral hypoglycemic drugs: Secondary | ICD-10-CM | POA: Diagnosis not present

## 2017-12-29 DIAGNOSIS — E782 Mixed hyperlipidemia: Secondary | ICD-10-CM | POA: Diagnosis not present

## 2017-12-29 DIAGNOSIS — I6330 Cerebral infarction due to thrombosis of unspecified cerebral artery: Secondary | ICD-10-CM | POA: Diagnosis not present

## 2017-12-29 DIAGNOSIS — A045 Campylobacter enteritis: Secondary | ICD-10-CM | POA: Diagnosis not present

## 2017-12-29 DIAGNOSIS — E1165 Type 2 diabetes mellitus with hyperglycemia: Secondary | ICD-10-CM | POA: Diagnosis not present

## 2017-12-29 DIAGNOSIS — I4891 Unspecified atrial fibrillation: Secondary | ICD-10-CM | POA: Diagnosis not present

## 2017-12-29 DIAGNOSIS — R42 Dizziness and giddiness: Secondary | ICD-10-CM | POA: Diagnosis not present

## 2017-12-29 DIAGNOSIS — I10 Essential (primary) hypertension: Secondary | ICD-10-CM | POA: Diagnosis not present

## 2017-12-29 DIAGNOSIS — N179 Acute kidney failure, unspecified: Secondary | ICD-10-CM | POA: Diagnosis not present

## 2017-12-29 DIAGNOSIS — R197 Diarrhea, unspecified: Secondary | ICD-10-CM | POA: Diagnosis not present

## 2017-12-29 DIAGNOSIS — R29701 NIHSS score 1: Secondary | ICD-10-CM | POA: Diagnosis not present

## 2017-12-29 DIAGNOSIS — Z9981 Dependence on supplemental oxygen: Secondary | ICD-10-CM | POA: Diagnosis not present

## 2017-12-29 DIAGNOSIS — R0902 Hypoxemia: Secondary | ICD-10-CM | POA: Diagnosis not present

## 2017-12-29 DIAGNOSIS — I110 Hypertensive heart disease with heart failure: Secondary | ICD-10-CM | POA: Diagnosis not present

## 2017-12-29 DIAGNOSIS — I639 Cerebral infarction, unspecified: Secondary | ICD-10-CM | POA: Diagnosis not present

## 2017-12-29 DIAGNOSIS — I252 Old myocardial infarction: Secondary | ICD-10-CM | POA: Diagnosis not present

## 2017-12-29 DIAGNOSIS — R4020 Unspecified coma: Secondary | ICD-10-CM | POA: Diagnosis not present

## 2017-12-29 DIAGNOSIS — I2510 Atherosclerotic heart disease of native coronary artery without angina pectoris: Secondary | ICD-10-CM | POA: Diagnosis not present

## 2017-12-29 DIAGNOSIS — S0990XA Unspecified injury of head, initial encounter: Secondary | ICD-10-CM | POA: Diagnosis not present

## 2017-12-29 DIAGNOSIS — R402441 Other coma, without documented Glasgow coma scale score, or with partial score reported, in the field [EMT or ambulance]: Secondary | ICD-10-CM | POA: Diagnosis not present

## 2017-12-29 DIAGNOSIS — R0602 Shortness of breath: Secondary | ICD-10-CM | POA: Diagnosis not present

## 2017-12-29 DIAGNOSIS — J449 Chronic obstructive pulmonary disease, unspecified: Secondary | ICD-10-CM | POA: Diagnosis not present

## 2017-12-29 DIAGNOSIS — R55 Syncope and collapse: Secondary | ICD-10-CM | POA: Diagnosis not present

## 2017-12-29 DIAGNOSIS — I6389 Other cerebral infarction: Secondary | ICD-10-CM | POA: Diagnosis not present

## 2017-12-29 DIAGNOSIS — R404 Transient alteration of awareness: Secondary | ICD-10-CM | POA: Diagnosis not present

## 2017-12-29 DIAGNOSIS — Z794 Long term (current) use of insulin: Secondary | ICD-10-CM | POA: Diagnosis not present

## 2017-12-29 DIAGNOSIS — Z87891 Personal history of nicotine dependence: Secondary | ICD-10-CM | POA: Diagnosis not present

## 2017-12-29 DIAGNOSIS — E860 Dehydration: Secondary | ICD-10-CM | POA: Diagnosis not present

## 2017-12-29 DIAGNOSIS — Z79899 Other long term (current) drug therapy: Secondary | ICD-10-CM | POA: Diagnosis not present

## 2017-12-29 DIAGNOSIS — E876 Hypokalemia: Secondary | ICD-10-CM | POA: Diagnosis not present

## 2017-12-29 DIAGNOSIS — R569 Unspecified convulsions: Secondary | ICD-10-CM | POA: Diagnosis not present

## 2017-12-29 DIAGNOSIS — R29705 NIHSS score 5: Secondary | ICD-10-CM | POA: Diagnosis not present

## 2017-12-29 DIAGNOSIS — G459 Transient cerebral ischemic attack, unspecified: Secondary | ICD-10-CM | POA: Diagnosis not present

## 2017-12-29 DIAGNOSIS — I5030 Unspecified diastolic (congestive) heart failure: Secondary | ICD-10-CM | POA: Diagnosis not present

## 2017-12-29 DIAGNOSIS — Z8669 Personal history of other diseases of the nervous system and sense organs: Secondary | ICD-10-CM | POA: Diagnosis not present

## 2017-12-29 DIAGNOSIS — Z7901 Long term (current) use of anticoagulants: Secondary | ICD-10-CM | POA: Diagnosis not present

## 2017-12-30 DIAGNOSIS — I10 Essential (primary) hypertension: Secondary | ICD-10-CM | POA: Diagnosis not present

## 2017-12-30 DIAGNOSIS — I2510 Atherosclerotic heart disease of native coronary artery without angina pectoris: Secondary | ICD-10-CM | POA: Diagnosis not present

## 2017-12-30 DIAGNOSIS — E1165 Type 2 diabetes mellitus with hyperglycemia: Secondary | ICD-10-CM | POA: Diagnosis not present

## 2017-12-30 DIAGNOSIS — R55 Syncope and collapse: Secondary | ICD-10-CM

## 2017-12-30 DIAGNOSIS — I639 Cerebral infarction, unspecified: Secondary | ICD-10-CM | POA: Diagnosis not present

## 2017-12-30 DIAGNOSIS — N179 Acute kidney failure, unspecified: Secondary | ICD-10-CM | POA: Diagnosis not present

## 2017-12-30 DIAGNOSIS — R42 Dizziness and giddiness: Secondary | ICD-10-CM | POA: Diagnosis not present

## 2017-12-30 DIAGNOSIS — E876 Hypokalemia: Secondary | ICD-10-CM | POA: Diagnosis not present

## 2017-12-30 DIAGNOSIS — I4891 Unspecified atrial fibrillation: Secondary | ICD-10-CM

## 2017-12-31 DIAGNOSIS — R42 Dizziness and giddiness: Secondary | ICD-10-CM

## 2018-01-02 DIAGNOSIS — J439 Emphysema, unspecified: Secondary | ICD-10-CM | POA: Diagnosis not present

## 2018-01-02 DIAGNOSIS — Z8674 Personal history of sudden cardiac arrest: Secondary | ICD-10-CM | POA: Diagnosis not present

## 2018-01-02 DIAGNOSIS — I110 Hypertensive heart disease with heart failure: Secondary | ICD-10-CM | POA: Diagnosis not present

## 2018-01-02 DIAGNOSIS — I480 Paroxysmal atrial fibrillation: Secondary | ICD-10-CM | POA: Diagnosis not present

## 2018-01-02 DIAGNOSIS — N179 Acute kidney failure, unspecified: Secondary | ICD-10-CM | POA: Diagnosis not present

## 2018-01-02 DIAGNOSIS — Z7951 Long term (current) use of inhaled steroids: Secondary | ICD-10-CM | POA: Diagnosis not present

## 2018-01-02 DIAGNOSIS — Z794 Long term (current) use of insulin: Secondary | ICD-10-CM | POA: Diagnosis not present

## 2018-01-02 DIAGNOSIS — Z9981 Dependence on supplemental oxygen: Secondary | ICD-10-CM | POA: Diagnosis not present

## 2018-01-02 DIAGNOSIS — Z8673 Personal history of transient ischemic attack (TIA), and cerebral infarction without residual deficits: Secondary | ICD-10-CM | POA: Diagnosis not present

## 2018-01-02 DIAGNOSIS — I2510 Atherosclerotic heart disease of native coronary artery without angina pectoris: Secondary | ICD-10-CM | POA: Diagnosis not present

## 2018-01-02 DIAGNOSIS — R001 Bradycardia, unspecified: Secondary | ICD-10-CM | POA: Diagnosis not present

## 2018-01-02 DIAGNOSIS — I509 Heart failure, unspecified: Secondary | ICD-10-CM | POA: Diagnosis not present

## 2018-01-02 DIAGNOSIS — Z95828 Presence of other vascular implants and grafts: Secondary | ICD-10-CM | POA: Diagnosis not present

## 2018-01-02 DIAGNOSIS — Z7901 Long term (current) use of anticoagulants: Secondary | ICD-10-CM | POA: Diagnosis not present

## 2018-01-02 DIAGNOSIS — E119 Type 2 diabetes mellitus without complications: Secondary | ICD-10-CM | POA: Diagnosis not present

## 2018-01-03 DIAGNOSIS — E1165 Type 2 diabetes mellitus with hyperglycemia: Secondary | ICD-10-CM | POA: Diagnosis not present

## 2018-01-03 DIAGNOSIS — Z79899 Other long term (current) drug therapy: Secondary | ICD-10-CM | POA: Diagnosis not present

## 2018-01-03 DIAGNOSIS — I639 Cerebral infarction, unspecified: Secondary | ICD-10-CM | POA: Diagnosis not present

## 2018-01-03 DIAGNOSIS — I4891 Unspecified atrial fibrillation: Secondary | ICD-10-CM | POA: Diagnosis not present

## 2018-01-03 DIAGNOSIS — J449 Chronic obstructive pulmonary disease, unspecified: Secondary | ICD-10-CM | POA: Diagnosis not present

## 2018-01-05 DIAGNOSIS — Z8673 Personal history of transient ischemic attack (TIA), and cerebral infarction without residual deficits: Secondary | ICD-10-CM | POA: Diagnosis not present

## 2018-01-05 DIAGNOSIS — I110 Hypertensive heart disease with heart failure: Secondary | ICD-10-CM | POA: Diagnosis not present

## 2018-01-05 DIAGNOSIS — I480 Paroxysmal atrial fibrillation: Secondary | ICD-10-CM | POA: Diagnosis not present

## 2018-01-05 DIAGNOSIS — N179 Acute kidney failure, unspecified: Secondary | ICD-10-CM | POA: Diagnosis not present

## 2018-01-05 DIAGNOSIS — I509 Heart failure, unspecified: Secondary | ICD-10-CM | POA: Diagnosis not present

## 2018-01-05 DIAGNOSIS — Z95828 Presence of other vascular implants and grafts: Secondary | ICD-10-CM | POA: Diagnosis not present

## 2018-01-05 DIAGNOSIS — Z794 Long term (current) use of insulin: Secondary | ICD-10-CM | POA: Diagnosis not present

## 2018-01-05 DIAGNOSIS — J439 Emphysema, unspecified: Secondary | ICD-10-CM | POA: Diagnosis not present

## 2018-01-05 DIAGNOSIS — Z7951 Long term (current) use of inhaled steroids: Secondary | ICD-10-CM | POA: Diagnosis not present

## 2018-01-05 DIAGNOSIS — R001 Bradycardia, unspecified: Secondary | ICD-10-CM | POA: Diagnosis not present

## 2018-01-05 DIAGNOSIS — E119 Type 2 diabetes mellitus without complications: Secondary | ICD-10-CM | POA: Diagnosis not present

## 2018-01-05 DIAGNOSIS — Z8674 Personal history of sudden cardiac arrest: Secondary | ICD-10-CM | POA: Diagnosis not present

## 2018-01-05 DIAGNOSIS — Z7901 Long term (current) use of anticoagulants: Secondary | ICD-10-CM | POA: Diagnosis not present

## 2018-01-05 DIAGNOSIS — Z9981 Dependence on supplemental oxygen: Secondary | ICD-10-CM | POA: Diagnosis not present

## 2018-01-05 DIAGNOSIS — I2510 Atherosclerotic heart disease of native coronary artery without angina pectoris: Secondary | ICD-10-CM | POA: Diagnosis not present

## 2018-01-07 DIAGNOSIS — Z8673 Personal history of transient ischemic attack (TIA), and cerebral infarction without residual deficits: Secondary | ICD-10-CM | POA: Diagnosis not present

## 2018-01-07 DIAGNOSIS — I480 Paroxysmal atrial fibrillation: Secondary | ICD-10-CM | POA: Diagnosis not present

## 2018-01-07 DIAGNOSIS — E119 Type 2 diabetes mellitus without complications: Secondary | ICD-10-CM | POA: Diagnosis not present

## 2018-01-07 DIAGNOSIS — Z95828 Presence of other vascular implants and grafts: Secondary | ICD-10-CM | POA: Diagnosis not present

## 2018-01-07 DIAGNOSIS — I509 Heart failure, unspecified: Secondary | ICD-10-CM | POA: Diagnosis not present

## 2018-01-07 DIAGNOSIS — Z7951 Long term (current) use of inhaled steroids: Secondary | ICD-10-CM | POA: Diagnosis not present

## 2018-01-07 DIAGNOSIS — Z794 Long term (current) use of insulin: Secondary | ICD-10-CM | POA: Diagnosis not present

## 2018-01-07 DIAGNOSIS — N179 Acute kidney failure, unspecified: Secondary | ICD-10-CM | POA: Diagnosis not present

## 2018-01-07 DIAGNOSIS — I110 Hypertensive heart disease with heart failure: Secondary | ICD-10-CM | POA: Diagnosis not present

## 2018-01-07 DIAGNOSIS — Z8674 Personal history of sudden cardiac arrest: Secondary | ICD-10-CM | POA: Diagnosis not present

## 2018-01-07 DIAGNOSIS — Z9981 Dependence on supplemental oxygen: Secondary | ICD-10-CM | POA: Diagnosis not present

## 2018-01-07 DIAGNOSIS — I2510 Atherosclerotic heart disease of native coronary artery without angina pectoris: Secondary | ICD-10-CM | POA: Diagnosis not present

## 2018-01-07 DIAGNOSIS — Z7901 Long term (current) use of anticoagulants: Secondary | ICD-10-CM | POA: Diagnosis not present

## 2018-01-07 DIAGNOSIS — R001 Bradycardia, unspecified: Secondary | ICD-10-CM | POA: Diagnosis not present

## 2018-01-07 DIAGNOSIS — J439 Emphysema, unspecified: Secondary | ICD-10-CM | POA: Diagnosis not present

## 2018-01-10 DIAGNOSIS — Z7901 Long term (current) use of anticoagulants: Secondary | ICD-10-CM | POA: Diagnosis not present

## 2018-01-10 DIAGNOSIS — I509 Heart failure, unspecified: Secondary | ICD-10-CM | POA: Diagnosis not present

## 2018-01-10 DIAGNOSIS — Z7951 Long term (current) use of inhaled steroids: Secondary | ICD-10-CM | POA: Diagnosis not present

## 2018-01-10 DIAGNOSIS — N179 Acute kidney failure, unspecified: Secondary | ICD-10-CM | POA: Diagnosis not present

## 2018-01-10 DIAGNOSIS — Z8673 Personal history of transient ischemic attack (TIA), and cerebral infarction without residual deficits: Secondary | ICD-10-CM | POA: Diagnosis not present

## 2018-01-10 DIAGNOSIS — R001 Bradycardia, unspecified: Secondary | ICD-10-CM | POA: Diagnosis not present

## 2018-01-10 DIAGNOSIS — Z8674 Personal history of sudden cardiac arrest: Secondary | ICD-10-CM | POA: Diagnosis not present

## 2018-01-10 DIAGNOSIS — Z9981 Dependence on supplemental oxygen: Secondary | ICD-10-CM | POA: Diagnosis not present

## 2018-01-10 DIAGNOSIS — I110 Hypertensive heart disease with heart failure: Secondary | ICD-10-CM | POA: Diagnosis not present

## 2018-01-10 DIAGNOSIS — I480 Paroxysmal atrial fibrillation: Secondary | ICD-10-CM | POA: Diagnosis not present

## 2018-01-10 DIAGNOSIS — Z95828 Presence of other vascular implants and grafts: Secondary | ICD-10-CM | POA: Diagnosis not present

## 2018-01-10 DIAGNOSIS — E119 Type 2 diabetes mellitus without complications: Secondary | ICD-10-CM | POA: Diagnosis not present

## 2018-01-10 DIAGNOSIS — I2510 Atherosclerotic heart disease of native coronary artery without angina pectoris: Secondary | ICD-10-CM | POA: Diagnosis not present

## 2018-01-10 DIAGNOSIS — Z794 Long term (current) use of insulin: Secondary | ICD-10-CM | POA: Diagnosis not present

## 2018-01-10 DIAGNOSIS — J439 Emphysema, unspecified: Secondary | ICD-10-CM | POA: Diagnosis not present

## 2018-01-11 DIAGNOSIS — R197 Diarrhea, unspecified: Secondary | ICD-10-CM | POA: Diagnosis not present

## 2018-01-17 DIAGNOSIS — I509 Heart failure, unspecified: Secondary | ICD-10-CM | POA: Diagnosis not present

## 2018-01-17 DIAGNOSIS — N179 Acute kidney failure, unspecified: Secondary | ICD-10-CM | POA: Diagnosis not present

## 2018-01-17 DIAGNOSIS — Z8674 Personal history of sudden cardiac arrest: Secondary | ICD-10-CM | POA: Diagnosis not present

## 2018-01-17 DIAGNOSIS — I480 Paroxysmal atrial fibrillation: Secondary | ICD-10-CM | POA: Diagnosis not present

## 2018-01-17 DIAGNOSIS — Z9981 Dependence on supplemental oxygen: Secondary | ICD-10-CM | POA: Diagnosis not present

## 2018-01-17 DIAGNOSIS — E119 Type 2 diabetes mellitus without complications: Secondary | ICD-10-CM | POA: Diagnosis not present

## 2018-01-17 DIAGNOSIS — R001 Bradycardia, unspecified: Secondary | ICD-10-CM | POA: Diagnosis not present

## 2018-01-17 DIAGNOSIS — I110 Hypertensive heart disease with heart failure: Secondary | ICD-10-CM | POA: Diagnosis not present

## 2018-01-17 DIAGNOSIS — Z7901 Long term (current) use of anticoagulants: Secondary | ICD-10-CM | POA: Diagnosis not present

## 2018-01-17 DIAGNOSIS — I2510 Atherosclerotic heart disease of native coronary artery without angina pectoris: Secondary | ICD-10-CM | POA: Diagnosis not present

## 2018-01-17 DIAGNOSIS — Z794 Long term (current) use of insulin: Secondary | ICD-10-CM | POA: Diagnosis not present

## 2018-01-17 DIAGNOSIS — Z95828 Presence of other vascular implants and grafts: Secondary | ICD-10-CM | POA: Diagnosis not present

## 2018-01-17 DIAGNOSIS — Z8673 Personal history of transient ischemic attack (TIA), and cerebral infarction without residual deficits: Secondary | ICD-10-CM | POA: Diagnosis not present

## 2018-01-17 DIAGNOSIS — J439 Emphysema, unspecified: Secondary | ICD-10-CM | POA: Diagnosis not present

## 2018-01-17 DIAGNOSIS — Z7951 Long term (current) use of inhaled steroids: Secondary | ICD-10-CM | POA: Diagnosis not present

## 2018-01-25 DIAGNOSIS — J439 Emphysema, unspecified: Secondary | ICD-10-CM | POA: Diagnosis not present

## 2018-01-25 DIAGNOSIS — E119 Type 2 diabetes mellitus without complications: Secondary | ICD-10-CM | POA: Diagnosis not present

## 2018-01-25 DIAGNOSIS — Z8673 Personal history of transient ischemic attack (TIA), and cerebral infarction without residual deficits: Secondary | ICD-10-CM | POA: Diagnosis not present

## 2018-01-25 DIAGNOSIS — I2510 Atherosclerotic heart disease of native coronary artery without angina pectoris: Secondary | ICD-10-CM | POA: Diagnosis not present

## 2018-01-25 DIAGNOSIS — I110 Hypertensive heart disease with heart failure: Secondary | ICD-10-CM | POA: Diagnosis not present

## 2018-01-25 DIAGNOSIS — Z95828 Presence of other vascular implants and grafts: Secondary | ICD-10-CM | POA: Diagnosis not present

## 2018-01-25 DIAGNOSIS — Z8674 Personal history of sudden cardiac arrest: Secondary | ICD-10-CM | POA: Diagnosis not present

## 2018-01-25 DIAGNOSIS — I509 Heart failure, unspecified: Secondary | ICD-10-CM | POA: Diagnosis not present

## 2018-01-25 DIAGNOSIS — I480 Paroxysmal atrial fibrillation: Secondary | ICD-10-CM | POA: Diagnosis not present

## 2018-01-25 DIAGNOSIS — Z794 Long term (current) use of insulin: Secondary | ICD-10-CM | POA: Diagnosis not present

## 2018-01-25 DIAGNOSIS — Z7901 Long term (current) use of anticoagulants: Secondary | ICD-10-CM | POA: Diagnosis not present

## 2018-01-25 DIAGNOSIS — R001 Bradycardia, unspecified: Secondary | ICD-10-CM | POA: Diagnosis not present

## 2018-01-25 DIAGNOSIS — Z7951 Long term (current) use of inhaled steroids: Secondary | ICD-10-CM | POA: Diagnosis not present

## 2018-01-25 DIAGNOSIS — Z9981 Dependence on supplemental oxygen: Secondary | ICD-10-CM | POA: Diagnosis not present

## 2018-01-25 DIAGNOSIS — N179 Acute kidney failure, unspecified: Secondary | ICD-10-CM | POA: Diagnosis not present

## 2018-01-30 DIAGNOSIS — I214 Non-ST elevation (NSTEMI) myocardial infarction: Secondary | ICD-10-CM | POA: Diagnosis not present

## 2018-01-30 DIAGNOSIS — I252 Old myocardial infarction: Secondary | ICD-10-CM | POA: Diagnosis not present

## 2018-01-30 DIAGNOSIS — I10 Essential (primary) hypertension: Secondary | ICD-10-CM | POA: Diagnosis not present

## 2018-01-30 DIAGNOSIS — I480 Paroxysmal atrial fibrillation: Secondary | ICD-10-CM | POA: Diagnosis not present

## 2018-01-30 DIAGNOSIS — I2510 Atherosclerotic heart disease of native coronary artery without angina pectoris: Secondary | ICD-10-CM | POA: Diagnosis not present

## 2018-02-05 DIAGNOSIS — Z1339 Encounter for screening examination for other mental health and behavioral disorders: Secondary | ICD-10-CM | POA: Diagnosis not present

## 2018-02-05 DIAGNOSIS — J449 Chronic obstructive pulmonary disease, unspecified: Secondary | ICD-10-CM | POA: Diagnosis not present

## 2018-02-05 DIAGNOSIS — E1165 Type 2 diabetes mellitus with hyperglycemia: Secondary | ICD-10-CM | POA: Diagnosis not present

## 2018-02-05 DIAGNOSIS — I4891 Unspecified atrial fibrillation: Secondary | ICD-10-CM | POA: Diagnosis not present

## 2018-02-05 DIAGNOSIS — E785 Hyperlipidemia, unspecified: Secondary | ICD-10-CM | POA: Diagnosis not present

## 2018-02-07 ENCOUNTER — Ambulatory Visit: Payer: Medicare Other | Admitting: Neurology

## 2018-02-26 ENCOUNTER — Telehealth: Payer: Self-pay | Admitting: Neurology

## 2018-02-26 ENCOUNTER — Encounter: Payer: Self-pay | Admitting: Neurology

## 2018-02-26 ENCOUNTER — Ambulatory Visit (INDEPENDENT_AMBULATORY_CARE_PROVIDER_SITE_OTHER): Payer: Medicare Other | Admitting: Neurology

## 2018-02-26 VITALS — BP 141/78 | HR 65 | Ht 65.5 in | Wt 186.0 lb

## 2018-02-26 DIAGNOSIS — G43709 Chronic migraine without aura, not intractable, without status migrainosus: Secondary | ICD-10-CM | POA: Insufficient documentation

## 2018-02-26 DIAGNOSIS — Z87828 Personal history of other (healed) physical injury and trauma: Secondary | ICD-10-CM | POA: Diagnosis not present

## 2018-02-26 DIAGNOSIS — D496 Neoplasm of unspecified behavior of brain: Secondary | ICD-10-CM | POA: Insufficient documentation

## 2018-02-26 DIAGNOSIS — IMO0002 Reserved for concepts with insufficient information to code with codable children: Secondary | ICD-10-CM | POA: Insufficient documentation

## 2018-02-26 MED ORDER — TIZANIDINE HCL 4 MG PO TABS: 4.0000 mg | ORAL_TABLET | Freq: Four times a day (QID) | ORAL | 4 refills | Status: AC | PRN

## 2018-02-26 MED ORDER — ONDANSETRON 4 MG PO TBDP: 4.0000 mg | ORAL_TABLET | Freq: Three times a day (TID) | ORAL | 6 refills | Status: AC | PRN

## 2018-02-26 MED ORDER — ZONISAMIDE 100 MG PO CAPS: 100.0000 mg | ORAL_CAPSULE | Freq: Two times a day (BID) | ORAL | 5 refills | Status: AC

## 2018-02-26 MED ORDER — BUTALBITAL-APAP-CAFFEINE 50-325-40 MG PO TABS: 1.0000 | ORAL_TABLET | Freq: Four times a day (QID) | ORAL | 5 refills | Status: AC | PRN

## 2018-02-26 NOTE — Patient Instructions (Signed)
During intense headaches,  You may take  Fioricet  Together with Zofran as needed for nausea  Tizanidine 4 mg as needed for muscle relaxant
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
## 2018-02-26 NOTE — Telephone Encounter (Signed)
[employer] medicare/[place] [facility] order sent to GI. No auth they will reach out to the patient to schedule

## 2018-02-28 ENCOUNTER — Other Ambulatory Visit: Payer: Medicare Other

## 2018-03-05 ENCOUNTER — Telehealth: Payer: Self-pay

## 2018-03-05 ENCOUNTER — Ambulatory Visit: Payer: Medicare Other

## 2018-03-05 NOTE — Telephone Encounter (Signed)
Patient was unable to have an EEG test due to equipment failing. Patient states she will call us to reschedule.

## 2018-03-23 ENCOUNTER — Inpatient Hospital Stay: Admission: RE | Admit: 2018-03-23 | Payer: Medicare Other | Source: Ambulatory Visit

## 2018-04-09 ENCOUNTER — Ambulatory Visit
Admission: RE | Admit: 2018-04-09 | Discharge: 2018-04-09 | Disposition: A | Discharge status: Home / Self Care | Payer: Medicare Other | Source: Ambulatory Visit | Attending: Neurology | Admitting: Neurology

## 2018-04-09 DIAGNOSIS — D496 Neoplasm of unspecified behavior of brain: Secondary | ICD-10-CM

## 2018-04-09 DIAGNOSIS — G43709 Chronic migraine without aura, not intractable, without status migrainosus: Secondary | ICD-10-CM

## 2018-04-09 DIAGNOSIS — IMO0002 Reserved for concepts with insufficient information to code with codable children: Secondary | ICD-10-CM

## 2018-04-09 MED ORDER — GADOBENATE DIMEGLUMINE 529 MG/ML IV SOLN
17.0000 mL | Freq: Once | INTRAVENOUS | Status: AC | PRN
  Administered 2018-04-09: 17 mL via INTRAVENOUS

## 2018-04-11 ENCOUNTER — Telehealth: Payer: Self-pay | Admitting: Neurology

## 2018-04-11 NOTE — Telephone Encounter (Signed)
Please call patient, MRI of brain showed right CP angle mass likely stable meningioma.   Supratentorium small vessel disease.   IMPRESSION:   MRI brain (with and without) demonstrating: - Mild scattered periventricular and subcortical chronic small vessel ischemic disease.  Left corona radiata lacunar ischemic infarction with rim of hemosiderin versus dystrophic mineralization. - Stable right cerebellopontine (CP) angle mass measuring 12 x 8 mm on axial views, with homogenous enhancement. Likely stable meningioma.  - No acute findings. - No new findings from MRI on 12/29/2017.

## 2018-04-11 NOTE — Telephone Encounter (Signed)
Spoke to patient - she is aware of the results and will keep her follow up with Dr. Krista Blue for further review.

## 2018-04-23 DIAGNOSIS — Z9181 History of falling: Secondary | ICD-10-CM | POA: Diagnosis not present

## 2018-04-23 DIAGNOSIS — Z1239 Encounter for other screening for malignant neoplasm of breast: Secondary | ICD-10-CM | POA: Diagnosis not present

## 2018-04-23 DIAGNOSIS — N959 Unspecified menopausal and perimenopausal disorder: Secondary | ICD-10-CM | POA: Diagnosis not present

## 2018-04-23 DIAGNOSIS — Z139 Encounter for screening, unspecified: Secondary | ICD-10-CM | POA: Diagnosis not present

## 2018-04-23 DIAGNOSIS — Z0000 Encounter for general adult medical examination without abnormal findings: Secondary | ICD-10-CM | POA: Diagnosis not present

## 2018-04-23 DIAGNOSIS — Z23 Encounter for immunization: Secondary | ICD-10-CM | POA: Diagnosis not present

## 2018-04-23 DIAGNOSIS — E785 Hyperlipidemia, unspecified: Secondary | ICD-10-CM | POA: Diagnosis not present

## 2018-04-26 DIAGNOSIS — M85852 Other specified disorders of bone density and structure, left thigh: Secondary | ICD-10-CM | POA: Diagnosis not present

## 2018-04-26 DIAGNOSIS — Z1231 Encounter for screening mammogram for malignant neoplasm of breast: Secondary | ICD-10-CM | POA: Diagnosis not present

## 2018-04-26 DIAGNOSIS — M8580 Other specified disorders of bone density and structure, unspecified site: Secondary | ICD-10-CM | POA: Diagnosis not present

## 2018-04-26 DIAGNOSIS — M85851 Other specified disorders of bone density and structure, right thigh: Secondary | ICD-10-CM | POA: Diagnosis not present

## 2018-05-02 DIAGNOSIS — D329 Benign neoplasm of meninges, unspecified: Secondary | ICD-10-CM | POA: Diagnosis not present

## 2018-05-02 DIAGNOSIS — G43909 Migraine, unspecified, not intractable, without status migrainosus: Secondary | ICD-10-CM | POA: Diagnosis not present

## 2018-05-27 DIAGNOSIS — E119 Type 2 diabetes mellitus without complications: Secondary | ICD-10-CM | POA: Diagnosis not present

## 2018-06-03 DIAGNOSIS — I252 Old myocardial infarction: Secondary | ICD-10-CM | POA: Diagnosis not present

## 2018-06-03 DIAGNOSIS — I2510 Atherosclerotic heart disease of native coronary artery without angina pectoris: Secondary | ICD-10-CM | POA: Diagnosis not present

## 2018-06-03 DIAGNOSIS — I480 Paroxysmal atrial fibrillation: Secondary | ICD-10-CM | POA: Diagnosis not present

## 2018-06-03 DIAGNOSIS — Z955 Presence of coronary angioplasty implant and graft: Secondary | ICD-10-CM | POA: Diagnosis not present

## 2018-06-03 DIAGNOSIS — I10 Essential (primary) hypertension: Secondary | ICD-10-CM | POA: Diagnosis not present

## 2018-06-04 ENCOUNTER — Ambulatory Visit: Payer: Medicare Other | Admitting: Neurology

## 2018-06-10 DIAGNOSIS — I10 Essential (primary) hypertension: Secondary | ICD-10-CM | POA: Diagnosis not present

## 2018-06-10 DIAGNOSIS — J449 Chronic obstructive pulmonary disease, unspecified: Secondary | ICD-10-CM | POA: Diagnosis not present

## 2018-06-10 DIAGNOSIS — I4891 Unspecified atrial fibrillation: Secondary | ICD-10-CM | POA: Diagnosis not present

## 2018-06-10 DIAGNOSIS — E1165 Type 2 diabetes mellitus with hyperglycemia: Secondary | ICD-10-CM | POA: Diagnosis not present

## 2018-06-10 DIAGNOSIS — E785 Hyperlipidemia, unspecified: Secondary | ICD-10-CM | POA: Diagnosis not present

## 2018-06-25 DIAGNOSIS — R55 Syncope and collapse: Secondary | ICD-10-CM | POA: Diagnosis not present

## 2018-10-09 DIAGNOSIS — E785 Hyperlipidemia, unspecified: Secondary | ICD-10-CM | POA: Diagnosis not present

## 2018-10-09 DIAGNOSIS — I10 Essential (primary) hypertension: Secondary | ICD-10-CM | POA: Diagnosis not present

## 2018-10-09 DIAGNOSIS — I4891 Unspecified atrial fibrillation: Secondary | ICD-10-CM | POA: Diagnosis not present

## 2018-10-09 DIAGNOSIS — E1165 Type 2 diabetes mellitus with hyperglycemia: Secondary | ICD-10-CM | POA: Diagnosis not present

## 2018-10-09 DIAGNOSIS — J449 Chronic obstructive pulmonary disease, unspecified: Secondary | ICD-10-CM | POA: Diagnosis not present

## 2018-10-23 DIAGNOSIS — I10 Essential (primary) hypertension: Secondary | ICD-10-CM | POA: Diagnosis not present

## 2018-10-23 DIAGNOSIS — I252 Old myocardial infarction: Secondary | ICD-10-CM | POA: Diagnosis not present

## 2018-10-23 DIAGNOSIS — I480 Paroxysmal atrial fibrillation: Secondary | ICD-10-CM | POA: Diagnosis not present

## 2018-10-23 DIAGNOSIS — E782 Mixed hyperlipidemia: Secondary | ICD-10-CM | POA: Diagnosis not present

## 2018-10-23 DIAGNOSIS — Z955 Presence of coronary angioplasty implant and graft: Secondary | ICD-10-CM | POA: Diagnosis not present

## 2018-10-30 DIAGNOSIS — D329 Benign neoplasm of meninges, unspecified: Secondary | ICD-10-CM | POA: Diagnosis not present

## 2018-10-30 DIAGNOSIS — G43909 Migraine, unspecified, not intractable, without status migrainosus: Secondary | ICD-10-CM | POA: Diagnosis not present

## 2018-11-05 DIAGNOSIS — I480 Paroxysmal atrial fibrillation: Secondary | ICD-10-CM | POA: Diagnosis not present

## 2018-11-11 DIAGNOSIS — I480 Paroxysmal atrial fibrillation: Secondary | ICD-10-CM | POA: Diagnosis not present

## 2018-11-27 DIAGNOSIS — E1165 Type 2 diabetes mellitus with hyperglycemia: Secondary | ICD-10-CM | POA: Diagnosis not present

## 2018-12-25 DIAGNOSIS — E1165 Type 2 diabetes mellitus with hyperglycemia: Secondary | ICD-10-CM | POA: Diagnosis not present

## 2019-02-11 DIAGNOSIS — I4891 Unspecified atrial fibrillation: Secondary | ICD-10-CM | POA: Diagnosis not present

## 2019-02-11 DIAGNOSIS — E1165 Type 2 diabetes mellitus with hyperglycemia: Secondary | ICD-10-CM | POA: Diagnosis not present

## 2019-02-11 DIAGNOSIS — I10 Essential (primary) hypertension: Secondary | ICD-10-CM | POA: Diagnosis not present

## 2019-02-11 DIAGNOSIS — E785 Hyperlipidemia, unspecified: Secondary | ICD-10-CM | POA: Diagnosis not present

## 2019-02-11 DIAGNOSIS — J449 Chronic obstructive pulmonary disease, unspecified: Secondary | ICD-10-CM | POA: Diagnosis not present

## 2019-03-19 DIAGNOSIS — E782 Mixed hyperlipidemia: Secondary | ICD-10-CM | POA: Diagnosis not present

## 2019-03-19 DIAGNOSIS — Z794 Long term (current) use of insulin: Secondary | ICD-10-CM | POA: Diagnosis not present

## 2019-03-19 DIAGNOSIS — I10 Essential (primary) hypertension: Secondary | ICD-10-CM | POA: Diagnosis not present

## 2019-03-19 DIAGNOSIS — E1165 Type 2 diabetes mellitus with hyperglycemia: Secondary | ICD-10-CM | POA: Diagnosis not present

## 2019-04-30 DIAGNOSIS — E785 Hyperlipidemia, unspecified: Secondary | ICD-10-CM | POA: Diagnosis not present

## 2019-04-30 DIAGNOSIS — Z139 Encounter for screening, unspecified: Secondary | ICD-10-CM | POA: Diagnosis not present

## 2019-04-30 DIAGNOSIS — Z1231 Encounter for screening mammogram for malignant neoplasm of breast: Secondary | ICD-10-CM | POA: Diagnosis not present

## 2019-04-30 DIAGNOSIS — Z9181 History of falling: Secondary | ICD-10-CM | POA: Diagnosis not present

## 2019-04-30 DIAGNOSIS — Z0000 Encounter for general adult medical examination without abnormal findings: Secondary | ICD-10-CM | POA: Diagnosis not present

## 2019-05-02 DIAGNOSIS — Z1231 Encounter for screening mammogram for malignant neoplasm of breast: Secondary | ICD-10-CM | POA: Diagnosis not present

## 2019-05-09 DIAGNOSIS — G43909 Migraine, unspecified, not intractable, without status migrainosus: Secondary | ICD-10-CM | POA: Diagnosis not present

## 2019-05-09 DIAGNOSIS — D329 Benign neoplasm of meninges, unspecified: Secondary | ICD-10-CM | POA: Diagnosis not present

## 2019-05-20 DIAGNOSIS — E782 Mixed hyperlipidemia: Secondary | ICD-10-CM | POA: Diagnosis not present

## 2019-05-20 DIAGNOSIS — I10 Essential (primary) hypertension: Secondary | ICD-10-CM | POA: Diagnosis not present

## 2019-05-20 DIAGNOSIS — Z794 Long term (current) use of insulin: Secondary | ICD-10-CM | POA: Diagnosis not present

## 2019-05-20 DIAGNOSIS — E1165 Type 2 diabetes mellitus with hyperglycemia: Secondary | ICD-10-CM | POA: Diagnosis not present

## 2019-05-21 DIAGNOSIS — I480 Paroxysmal atrial fibrillation: Secondary | ICD-10-CM | POA: Diagnosis not present

## 2019-05-21 DIAGNOSIS — I2510 Atherosclerotic heart disease of native coronary artery without angina pectoris: Secondary | ICD-10-CM | POA: Diagnosis not present

## 2019-05-21 DIAGNOSIS — E782 Mixed hyperlipidemia: Secondary | ICD-10-CM | POA: Diagnosis not present

## 2019-05-21 DIAGNOSIS — I10 Essential (primary) hypertension: Secondary | ICD-10-CM | POA: Diagnosis not present

## 2019-05-21 DIAGNOSIS — I252 Old myocardial infarction: Secondary | ICD-10-CM | POA: Diagnosis not present

## 2019-05-28 DIAGNOSIS — I6782 Cerebral ischemia: Secondary | ICD-10-CM | POA: Diagnosis not present

## 2019-05-28 DIAGNOSIS — D329 Benign neoplasm of meninges, unspecified: Secondary | ICD-10-CM | POA: Diagnosis not present

## 2019-06-09 DIAGNOSIS — D329 Benign neoplasm of meninges, unspecified: Secondary | ICD-10-CM | POA: Diagnosis not present

## 2019-06-10 ENCOUNTER — Encounter: Payer: Self-pay | Admitting: *Deleted

## 2019-06-12 ENCOUNTER — Other Ambulatory Visit: Payer: Self-pay | Admitting: Radiation Therapy

## 2019-06-12 DIAGNOSIS — D329 Benign neoplasm of meninges, unspecified: Secondary | ICD-10-CM

## 2019-06-16 NOTE — Progress Notes (Signed)
Location/Histology of Brain Tumor: right porous acouste  Patient presented with symptoms of:  Follow up of menginoma   Past or anticipated interventions, if any, per neurosurgery: no  Past or anticipated interventions, if any, per medical oncology: no  Dose of Decadron, if applicable:no  Recent neurologic symptoms, if any:   Seizures: no  Headaches: yes  Nausea: no  Dizziness/ataxia: yes  Difficulty with hand coordination: no  Focal numbness/weakness: no  Visual deficits/changes: no  Confusion/Memory deficits: no    SAFETY ISSUES:  Prior radiation? no  Pacemaker/ICD? no  Possible current pregnancy? no  Is the patient on methotrexate? no  Additional Complaints / other details: Spoke with patient via phone she states she does not have a computer to do my chart video. Denies seizures but is taking Keppra. She does fall frequently from dizziness. She also complains of migraines. Denies any visual field disturbances.

## 2019-06-17 ENCOUNTER — Other Ambulatory Visit: Payer: Self-pay

## 2019-06-17 ENCOUNTER — Encounter: Payer: Self-pay | Admitting: Radiation Oncology

## 2019-06-17 ENCOUNTER — Ambulatory Visit
Admission: RE | Admit: 2019-06-17 | Discharge: 2019-06-17 | Disposition: A | Discharge status: Home / Self Care | Payer: Medicare Other | Source: Ambulatory Visit | Attending: Radiation Oncology | Admitting: Radiation Oncology

## 2019-06-17 DIAGNOSIS — I6381 Other cerebral infarction due to occlusion or stenosis of small artery: Secondary | ICD-10-CM | POA: Diagnosis not present

## 2019-06-17 DIAGNOSIS — D320 Benign neoplasm of cerebral meninges: Secondary | ICD-10-CM | POA: Diagnosis not present

## 2019-06-17 DIAGNOSIS — I619 Nontraumatic intracerebral hemorrhage, unspecified: Secondary | ICD-10-CM | POA: Diagnosis not present

## 2019-06-17 DIAGNOSIS — D329 Benign neoplasm of meninges, unspecified: Secondary | ICD-10-CM | POA: Insufficient documentation

## 2019-06-17 DIAGNOSIS — I6782 Cerebral ischemia: Secondary | ICD-10-CM | POA: Diagnosis not present

## 2019-06-17 MED ORDER — GADOBENATE DIMEGLUMINE 529 MG/ML IV SOLN
17.0000 mL | Freq: Once | INTRAVENOUS | Status: AC | PRN
  Administered 2019-06-17: 17 mL via INTRAVENOUS

## 2019-06-17 NOTE — Patient Instructions (Signed)
Coronavirus (COVID-19) Are you at risk?  Are you at risk for the Coronavirus (COVID-19)?  To be considered HIGH RISK for Coronavirus (COVID-19), you have to meet the following criteria:  . Traveled to [place], [place], [place], [place] or [place]; or in the [place] to [place], [place], [place], or [place]; and have fever, cough, and shortness of breath within the last 2 weeks of travel OR . Been in close contact with a person diagnosed with COVID-19 within the last 2 weeks and have fever, cough, and shortness of breath . IF YOU DO NOT MEET THESE CRITERIA, YOU ARE CONSIDERED LOW RISK FOR COVID-19.  What to do if you are HIGH RISK for COVID-19?  . If you are having a medical emergency, call 911. . Seek medical care right away. Before you go to a doctor's office, urgent care or emergency department, call ahead and tell them about your recent travel, contact with someone diagnosed with COVID-19, and your symptoms. You should receive instructions from your physician's office regarding next steps of care.  . When you arrive at healthcare provider, tell the healthcare staff immediately you have returned from visiting [place], [place], [place], [place] or [place]; or traveled in the [place] to [place], [place], [place], or [place]; in the last two weeks or you have been in close contact with a person diagnosed with COVID-19 in the last 2 weeks.   . Tell the health care staff about your symptoms: fever, cough and shortness of breath. . After you have been seen by a medical provider, you will be either: o Tested for (COVID-19) and discharged home on quarantine except to seek medical care if symptoms worsen, and asked to  - Stay home and avoid contact with others until you get your results (4-5 days)  - Avoid travel on public transportation if possible (such as bus, train, or airplane) or o Sent to the Emergency Department by EMS for evaluation, COVID-19 testing, and possible  admission depending on your condition and test results.  What to do if you are LOW RISK for COVID-19?  Reduce your risk of any infection by using the same precautions used for avoiding the common cold or flu:  . Wash your hands often with soap and warm water for at least 20 seconds.  If soap and water are not readily available, use an alcohol-based hand sanitizer with at least 60% alcohol.  . If coughing or sneezing, cover your mouth and nose by coughing or sneezing into the elbow areas of your shirt or coat, into a tissue or into your sleeve (not your hands). . Avoid shaking hands with others and consider head nods or verbal greetings only. . Avoid touching your eyes, nose, or mouth with unwashed hands.  . Avoid close contact with people who are sick. . Avoid places or events with large numbers of people in one location, like concerts or sporting events. . Carefully consider travel plans you have or are making. . If you are planning any travel outside or inside the [place], visit the CDC's Travelers' Health webpage for the latest health notices. . If you have some symptoms but not all symptoms, continue to monitor at home and seek medical attention if your symptoms worsen. . If you are having a medical emergency, call 911.   ADDITIONAL HEALTHCARE OPTIONS FOR PATIENTS  [facility] / e-Visit: [email or web address]         [facility]: [phone]  [facility]   [facility]gent [facility]: [phone]                   [facility]: [phone]   

## 2019-06-18 NOTE — Progress Notes (Signed)
Radiation Oncology         [phone]) [phone] ________________________________  Name: Audrey Patel MRN: [id number]  Date: 06/17/2019  DOB: 1950/11/23  [id number], Estill Bamberg., MD  Consuella Lose, MD     REFERRING PHYSICIAN: Consuella Lose, MD     Telemedicine Due to the coronavirus pandemic, this encounter was provided by telephone.  The patient has given verbal consent for this type of encounter and has been advised to only accept a meeting of this type in a secure network environment. The time spent during this encounter was 30 minutes and more than 50% of that time was spent in the coordination of the patient's care. The attendants for this meeting include  Dr. Lisbeth Renshaw and Drucie Ip.  During the encounter,  Dr. Lisbeth Renshaw, and was located at [facility] Radiation Oncology Department.  The patient was located at home.     DIAGNOSIS: The encounter diagnosis was Benign neoplasm of cerebral meninges ([place]).   HISTORY OF PRESENT ILLNESS::Audrey Patel is a 68 y.o. female who is seen for an initial consultation visit regarding the patient's diagnosis of meningioma.  The patient presented with chronic headaches and work-up incidentally found a small right sided CP angle meningioma.  This has been followed and has shown some growth over the last 4 to 5 years.  The patient continues to complain of some headaches as well as low back and leg pain, some unsteadiness of gait.  The patient denies any change in hearing.  She also denies any facial muscle weakness or numbness.  The patient has been seen by neurosurgery who feels that surgery may be a high risk procedure for her.  Given her comorbidities as well as the size and location of the tumor, I have been asked to see the patient for consideration of possible radiosurgery.    PREVIOUS RADIATION THERAPY: No   PAST MEDICAL HISTORY:  has a past medical history of Acid reflux, Asthma, Atrial fibrillation (HCC), CAD (coronary  artery disease), COPD (chronic obstructive pulmonary disease) ([place]), CVA (cerebral vascular accident) ([place]), Depression, Depression with anxiety, Diabetes ([place]), Heart attack ([place]), Hyperlipemia, Hypertension, Meningioma ([place]), Migraine, NSTEMI (non-ST elevated myocardial infarction) ([place]), Osteoporosis, and TIA (transient ischemic attack).     PAST SURGICAL HISTORY: Past Surgical History:  Procedure Laterality Date   ABDOMINAL HYSTERECTOMY     CHOLECYSTECTOMY       FAMILY HISTORY: family history includes Alcoholism in her father; Arthritis in her father and mother; Diabetes in her father and mother; Heart attack in her father and mother; Hypercholesterolemia in her father and mother; Hypertension in her father and mother.   SOCIAL HISTORY:  reports that she has quit smoking. Her smoking use included cigarettes. She has never used smokeless tobacco. She reports previous alcohol use. She reports that she does not use drugs.   ALLERGIES: Codeine and Penicillins   MEDICATIONS:  Current Outpatient Medications  Medication Sig Dispense Refill   alendronate (FOSAMAX) 70 MG tablet Take 70 mg by mouth once a week. Take with a full glass of water on an empty stomach.     amLODipine (NORVASC) 10 MG tablet Take 10 mg by mouth daily.     apixaban (ELIQUIS) 5 MG TABS tablet Take 5 mg by mouth 2 (two) times daily.     aspirin EC 81 MG tablet 81 mg.     baclofen (LIORESAL) 10 MG tablet Take 10 mg by mouth every 6 (six) hours as needed.     butalbital-acetaminophen-caffeine (FIORICET,  ESGIC) 50-325-40 MG tablet Take 1 tablet by mouth every 6 (six) hours as needed for headache. No refill in less than 30 days 15 tablet 5   carvedilol (COREG) 12.5 MG tablet Take 12.5 mg by mouth 2 (two) times daily with a meal.     Dulaglutide (TRULICITY) 1.5 0000000 SOPN Inject into the skin.     Fluticasone-Salmeterol (ADVAIR) 250-50 MCG/DOSE AEPB Inhale 1 puff into the lungs 2 (two) times daily.      insulin glargine (LANTUS) 100 UNIT/ML injection Inject 45 Units into the skin 2 (two) times daily.     metFORMIN (GLUCOPHAGE) 1000 MG tablet Take 500 mg by mouth 2 (two) times daily with a meal.     nitroGLYCERIN (NITROSTAT) 0.4 MG SL tablet Place under the tongue.     omeprazole (PRILOSEC) 20 MG capsule Take 20 mg by mouth 2 (two) times daily before a meal.     rosuvastatin (CRESTOR) 40 MG tablet Take 40 mg by mouth daily.     sertraline (ZOLOFT) 50 MG tablet Take 50 mg by mouth daily.     ticagrelor (BRILINTA) 90 MG TABS tablet Take by mouth 2 (two) times daily.     tiZANidine (ZANAFLEX) 4 MG tablet Take 1 tablet (4 mg total) by mouth every 6 (six) hours as needed for muscle spasms. 15 tablet 4   Vitamin D, Ergocalciferol, (DRISDOL) 50000 units CAPS capsule Take 50,000 Units by mouth every 7 (seven) days.     zonisamide (ZONEGRAN) 100 MG capsule Take 1 capsule (100 mg total) by mouth 2 (two) times daily. 60 capsule 5   Fluticasone Propionate (FLONASE ALLERGY RELIEF NA) Place 1 spray into the nose daily.     ondansetron (ZOFRAN ODT) 4 MG disintegrating tablet Take 1 tablet (4 mg total) by mouth every 8 (eight) hours as needed. (Patient not taking: Reported on 06/17/2019) 20 tablet 6   No current facility-administered medications for this encounter.      REVIEW OF SYSTEMS:  A 15 point review of systems is documented in the electronic medical record. This was obtained by the nursing staff. However, I reviewed this with the patient to discuss relevant findings and make appropriate changes.  Pertinent items are noted in HPI.    PHYSICAL EXAM:  vitals were not taken for this visit.   ECOG = 2  0 - Asymptomatic (Fully active, able to carry on all predisease activities without restriction)  1 - Symptomatic but completely ambulatory (Restricted in physically strenuous activity but ambulatory and able to carry out work of a light or sedentary nature. For example, light housework, office  work)  2 - Symptomatic, <50% in bed during the day (Ambulatory and capable of all self care but unable to carry out any work activities. Up and about more than 50% of waking hours)  3 - Symptomatic, >50% in bed, but not bedbound (Capable of only limited self-care, confined to bed or chair 50% or more of waking hours)  4 - Bedbound (Completely disabled. Cannot carry on any self-care. Totally confined to bed or chair)  5 - Death   Eustace Pen MM, Creech RH, Tormey DC, et al. [phone]). "Toxicity and response criteria of the [facility]". [place] Oncol. 5 (6): 649-55     LABORATORY DATA:  No results found for: WBC, HGB, HCT, MCV, PLT No results found for: NA, K, CL, CO2 No results found for: ALT, AST, GGT, ALKPHOS, BILITOT    RADIOGRAPHY: Mr Jeri Cos Wo Contrast  Result Date: 06/18/2019 CLINICAL DATA:  68 year old female with history of right cerebellopontine angle mass favored to be meningioma. Subsequent encounter. EXAM: MRI HEAD WITHOUT AND WITH CONTRAST TECHNIQUE: Multiplanar, multiecho pulse sequences of the brain and surrounding structures were obtained without and with intravenous contrast. CONTRAST:  38mL MULTIHANCE GADOBENATE DIMEGLUMINE 529 MG/ML IV SOLN COMPARISON:  [facility] brain MRI 05/28/2019 and earlier. FINDINGS: Brain: Chronic extra-axial homogeneously enhancing oval and mildly lobulated mass overlying the right porus acusticus encompasses 15 x 11 x 17 millimeters (AP by transverse by CC). This has been present since at least 2014 and has slowly enlarged. Size is increased by about 2 millimeters since May 2019. As before, no evidence of extension into the right IAC. Possible small dural tail depicted on series 14, image 24 today. Minimal mass effect on the brainstem with no brainstem edema. There is also a punctate area of enhancement in the central right cerebellum on series 13, image 36. This was subtle on the MRI  earlier this month, not identified in 2019. No associated T2 or FLAIR hyperintensity, but there does appear to be chronic microhemorrhage at this site on series 8, image 20 which is also new from 2019. No other abnormal intracranial enhancement or dural thickening identified. No restricted diffusion or evidence of acute infarction. Chronic lacunar infarcts in the left corona radiata, bilateral deep gray nuclei. Additional patchy and scattered bilateral cerebral white matter T2 and FLAIR hyperintensity. No cortical encephalomalacia. But there are additional chronic micro hemorrhages including in the right temporal lobe (series 8, image 49). No midline shift, ventriculomegaly, or acute intracranial hemorrhage. Cervicomedullary junction and pituitary are within normal limits. Vascular: Major intracranial vascular flow voids are stable, generalized intracranial artery ectasia. The major dural venous sinuses are enhancing and appear to be patent. Skull and upper cervical spine: Negative visible cervical spine and spinal cord. Visualized bone marrow signal is within normal limits. Sinuses/Orbits: Negative orbits. Paranasal Visualized paranasal sinuses and mastoids are stable and well pneumatized. Other: Left internal auditory structures appear normal. Stylomastoid foramina appear normal. Negative scalp and face soft tissues. IMPRESSION: 1. Chronic right CP angle mass overlying the right porus acusticus favored to be meningioma. Stable from the recent 05/28/2019 MRI, slowly enlarging since first noted in 2014. No extension into the right IAC. Minimal mass effect on the brainstem with no brainstem edema. 2. Superimposed chronic small vessel ischemia. A punctate micro-hemorrhage in the right cerebellum is new since 2019 and associated with mild enhancement. Burtis Junes this is post ischemic, but attention is directed on follow-up. 3. Chronic intracranial artery ectasia. Electronically Signed   By: Genevie Ann M.D.   On: 06/18/2019  05:45       IMPRESSION:  The patient has a small right sided CP angle meningioma which has been slowly enlarging over the last 4 to 5 years.  She is not felt to be a good candidate for surgery.  At this time, I believe that the patient is a good candidate for radiosurgery to this lesion.  I discussed the rationale of such a treatment with the patient.  We discussed the logistics of treatment as well as possible side effects and risks.  All of her questions were answered and she does wish to proceed with such a treatment at this time.   PLAN: The patient will undergo MRI scan for detailed planning later today.  We also discussed that she will proceed with a simulation in the near future for treatment  planning.  I anticipate treating the patient with a single fraction of radiosurgery to the lesion.    ________________________________   Jodelle Gross, MD, PhD   **Disclaimer: This note was dictated with voice recognition software. Similar sounding words can inadvertently be transcribed and this note may contain transcription errors which may not have been corrected upon publication of note.**

## 2019-06-24 ENCOUNTER — Ambulatory Visit: Payer: Medicare Other

## 2019-06-24 ENCOUNTER — Ambulatory Visit: Payer: Medicare Other | Admitting: Radiation Oncology

## 2019-06-25 ENCOUNTER — Ambulatory Visit
Admission: RE | Admit: 2019-06-25 | Discharge: 2019-06-25 | Disposition: A | Discharge status: Home / Self Care | Payer: Medicare Other | Source: Ambulatory Visit | Attending: Radiation Oncology | Admitting: Radiation Oncology

## 2019-06-25 ENCOUNTER — Other Ambulatory Visit: Payer: Self-pay

## 2019-06-25 ENCOUNTER — Other Ambulatory Visit: Payer: Self-pay | Admitting: Radiation Therapy

## 2019-06-25 VITALS — BP 185/97 | HR 92 | Temp 98.7°F | Resp 18

## 2019-06-25 DIAGNOSIS — D329 Benign neoplasm of meninges, unspecified: Secondary | ICD-10-CM

## 2019-06-25 DIAGNOSIS — D320 Benign neoplasm of cerebral meninges: Secondary | ICD-10-CM | POA: Diagnosis not present

## 2019-06-25 DIAGNOSIS — Z510 Encounter for antineoplastic radiation therapy: Secondary | ICD-10-CM | POA: Diagnosis not present

## 2019-06-25 DIAGNOSIS — Z7984 Long term (current) use of oral hypoglycemic drugs: Secondary | ICD-10-CM | POA: Insufficient documentation

## 2019-06-25 DIAGNOSIS — C7931 Secondary malignant neoplasm of brain: Secondary | ICD-10-CM

## 2019-06-25 DIAGNOSIS — E118 Type 2 diabetes mellitus with unspecified complications: Secondary | ICD-10-CM | POA: Diagnosis not present

## 2019-06-25 MED ORDER — SODIUM CHLORIDE 0.9% FLUSH
10.0000 mL | Freq: Once | INTRAVENOUS | Status: AC
  Administered 2019-06-25: 16:00:00 10 mL via INTRAVENOUS

## 2019-06-25 NOTE — Progress Notes (Signed)
Has armband been applied?  Yes.    Does patient have an allergy to IV contrast dye?: No.   Has patient ever received premedication for IV contrast dye?: No.   Does patient take metformin?: Yes.    If patient does take metformin when was the last dose: 06/24/2019  Date of lab work: 06/17/2019 BUN:  CR: 1.2 GFR: 46  IV site: antecubital right, condition no redness  Has IV site been added to flowsheet?  Yes.    BP (!) 185/97   Pulse 92   Temp 98.7 F (37.1 C) (Oral)   Resp 18   SpO2 100%

## 2019-06-27 ENCOUNTER — Other Ambulatory Visit: Payer: Self-pay

## 2019-06-27 ENCOUNTER — Telehealth: Payer: Self-pay | Admitting: Radiation Therapy

## 2019-06-27 ENCOUNTER — Ambulatory Visit: Payer: Medicare Other

## 2019-06-27 ENCOUNTER — Ambulatory Visit
Admission: RE | Admit: 2019-06-27 | Discharge: 2019-06-27 | Disposition: A | Discharge status: Home / Self Care | Payer: Medicare Other | Source: Ambulatory Visit | Attending: Radiation Oncology | Admitting: Radiation Oncology

## 2019-06-27 DIAGNOSIS — Z7984 Long term (current) use of oral hypoglycemic drugs: Secondary | ICD-10-CM | POA: Diagnosis not present

## 2019-06-27 DIAGNOSIS — E118 Type 2 diabetes mellitus with unspecified complications: Secondary | ICD-10-CM | POA: Diagnosis not present

## 2019-06-27 DIAGNOSIS — D329 Benign neoplasm of meninges, unspecified: Secondary | ICD-10-CM

## 2019-06-27 DIAGNOSIS — Z510 Encounter for antineoplastic radiation therapy: Secondary | ICD-10-CM | POA: Diagnosis not present

## 2019-06-27 DIAGNOSIS — D320 Benign neoplasm of cerebral meninges: Secondary | ICD-10-CM | POA: Diagnosis not present

## 2019-06-27 LAB — BUN & CREATININE (CHCC)
BUN: 19 mg/dL (ref 8–23)
Creatinine: 1.36 mg/dL — ABNORMAL HIGH (ref 0.44–1.00)
GFR, Est AFR Am: 46 mL/min — ABNORMAL LOW (ref >60)
GFR, Estimated: 40 mL/min — ABNORMAL LOW (ref >60)

## 2019-06-27 NOTE — Telephone Encounter (Signed)
I spoke with Ms. Popelka about her lab results and the recommendation to continue holding her Metformin until after rechecking her labs on Tuesday 12/9. We will redraw her labs prior to her radiation and let her know how to proceed from there. She expressed understanding and has changed her appointment arrival time to 2:00pm on Tuesday 12/9 for labs first.   Mont Dutton R.T.(R)(T) Radiation Special Procedures Navigator

## 2019-06-30 ENCOUNTER — Other Ambulatory Visit: Payer: Self-pay | Admitting: *Deleted

## 2019-06-30 DIAGNOSIS — D329 Benign neoplasm of meninges, unspecified: Secondary | ICD-10-CM

## 2019-07-01 ENCOUNTER — Ambulatory Visit
Admission: RE | Admit: 2019-07-01 | Discharge: 2019-07-01 | Disposition: A | Discharge status: Home / Self Care | Payer: Medicare Other | Source: Ambulatory Visit | Attending: Radiation Oncology | Admitting: Radiation Oncology

## 2019-07-01 ENCOUNTER — Other Ambulatory Visit: Payer: Self-pay

## 2019-07-01 VITALS — BP 204/104 | HR 102 | Temp 97.3°F | Resp 18

## 2019-07-01 DIAGNOSIS — D320 Benign neoplasm of cerebral meninges: Secondary | ICD-10-CM

## 2019-07-01 DIAGNOSIS — D496 Neoplasm of unspecified behavior of brain: Secondary | ICD-10-CM | POA: Diagnosis not present

## 2019-07-01 DIAGNOSIS — Z7984 Long term (current) use of oral hypoglycemic drugs: Secondary | ICD-10-CM | POA: Diagnosis not present

## 2019-07-01 DIAGNOSIS — I2510 Atherosclerotic heart disease of native coronary artery without angina pectoris: Secondary | ICD-10-CM | POA: Diagnosis not present

## 2019-07-01 DIAGNOSIS — D329 Benign neoplasm of meninges, unspecified: Secondary | ICD-10-CM

## 2019-07-01 DIAGNOSIS — E118 Type 2 diabetes mellitus with unspecified complications: Secondary | ICD-10-CM | POA: Diagnosis not present

## 2019-07-01 DIAGNOSIS — Z510 Encounter for antineoplastic radiation therapy: Secondary | ICD-10-CM | POA: Diagnosis not present

## 2019-07-01 DIAGNOSIS — R0789 Other chest pain: Secondary | ICD-10-CM | POA: Diagnosis not present

## 2019-07-01 LAB — BUN & CREATININE (CHCC)
BUN: 24 mg/dL — ABNORMAL HIGH (ref 8–23)
Creatinine: 1.4 mg/dL — ABNORMAL HIGH (ref 0.44–1.00)
GFR, Est AFR Am: 44 mL/min — ABNORMAL LOW (ref >60)
GFR, Estimated: 38 mL/min — ABNORMAL LOW (ref >60)

## 2019-07-01 NOTE — Progress Notes (Signed)
  Radiation Oncology         (336) [phone] ________________________________  Name: Audrey Patel MRN: [id number]  Date: 06/25/2019  DOB: 03/31/1951  DIAGNOSIS:     ICD-10-CM   1. Benign neoplasm of cerebral meninges (HCC)  D32.0     NARRATIVE:  The patient was brought to the [facility].  Identity was confirmed.  All relevant records and images related to the planned course of therapy were reviewed.  The patient freely provided informed written consent to proceed with treatment after reviewing the details related to the planned course of therapy. The consent form was witnessed and verified by the simulation staff. Intravenous access was established for contrast administration. Then, the patient was set-up in a stable reproducible supine position for radiation therapy.  A relocatable thermoplastic stereotactic head frame was fabricated for precise immobilization.  CT images were obtained.  Surface markings were placed.  The CT images were loaded into the planning software and fused with the patient's targeting MRI scan.  Then the target and avoidance structures were contoured.  Treatment planning then occurred.  The radiation prescription was entered and confirmed.  I have requested 3D planning  I have requested a DVH of the following structures: Brain stem, brain, left eye, right eye, lenses, optic chiasm, target volumes, uninvolved brain, and normal tissue.    SPECIAL TREATMENT PROCEDURE:  The planned course of therapy using radiation constitutes a special treatment procedure. Special care is required in the management of this patient for the following reasons. This treatment constitutes a Special Treatment Procedure for the following reason: High dose per fraction requiring special monitoring for increased toxicities of treatment including daily imaging.  The special nature of the planned course of radiotherapy will require increased physician supervision and oversight to ensure  patient's safety with optimal treatment outcomes.  PLAN:  The patient will receive 14 Gy in 1 fraction.   ------------------------------------------------  Jodelle Gross, MD, PhD

## 2019-07-01 NOTE — Addendum Note (Signed)
Encounter addended by: Ross Marcus, RN on: 07/01/2019 3:51 PM  Actions taken: Flowsheet accepted, Clinical Note Signed

## 2019-07-01 NOTE — Progress Notes (Signed)
  Radiation Oncology         [phone]) [phone] ________________________________  Name: LENIA MARING MRN: [id number]  Date: 07/01/2019  DOB: 06-19-1951   SPECIAL TREATMENT PROCEDURE   3D TREATMENT PLANNING AND DOSIMETRY: The patient's radiation plan was reviewed and approved by Dr. Elenor Legato from neurosurgery and radiation oncology prior to treatment. It showed 3-dimensional radiation distributions overlaid onto the planning CT/MRI image set. The [facility] for the target structures as well as the organs at risk were reviewed. The documentation of the 3D plan and dosimetry are filed in the radiation oncology EMR.   NARRATIVE: The patient was brought to the TrueBeam stereotactic radiation treatment machine and placed supine on the CT couch. The head frame was applied, and the patient was set up for stereotactic radiosurgery. Neurosurgery was present for the set-up and delivery   SIMULATION VERIFICATION: In the couch zero-angle position, the patient underwent Exactrac imaging using the Brainlab system with orthogonal KV images. These were carefully aligned and repeated to confirm treatment position for each of the isocenters. The Exactrac snap film verification was repeated at each couch angle.   SPECIAL TREATMENT PROCEDURE: The patient received stereotactic radiosurgery to the following target:  PTV1 target was treated using 4 Arcs to a prescription dose of 14 Gy. ExacTrac Snap verification was performed for each couch angle.   STEREOTACTIC TREATMENT MANAGEMENT: Following delivery, the patient was transported to nursing in stable condition and monitored for possible acute effects. Vital signs were recorded . The patient tolerated treatment without significant acute effects, and was discharged to home in stable condition.  PLAN: Follow-up in one month.   ------------------------------------------------  Jodelle Gross, MD, PhD

## 2019-07-01 NOTE — Addendum Note (Signed)
Encounter addended by: Hayden Pedro, PA-C on: 07/01/2019 3:52 PM  Actions taken: Clinical Note Signed

## 2019-07-01 NOTE — Progress Notes (Signed)
Patient in post [place] her BP is 204/104, 209/97 retaken in RA 203/114 hr 102,97,98. She complains of a headache. States she did not take her BP meds today. Will discuss with Bryson Ha what we should do for patient. Patient is to take her medication and recheck her BP in one hour. She is to present to the ED or call her primary care physician if she is not feeling better after her meds.

## 2019-07-01 NOTE — Progress Notes (Signed)
I was notified by the nursing staff that following the patient's [place] treatment today she presented to the clinic and was having a headache.  She did have a Cardiolite study today and was not able to take her antihypertensives prior to this.  Unfortunately she did not bring her medication with her and has not had her morning medicines.  In the clinic her blood pressure is 204/104.  Neurologically she is intact without evidence of strokelike symptoms, after about 15 minutes her blood pressure was rechecked and was 188/108.  Her pulse was 101.  I confirmed that she continues to be without strokelike symptoms and discussed with her PCP Dr. Megan Salon.  He recommends that she return home and take her morning meds as soon as she gets home, about an hour later she will repeat her blood pressure and if it is persistently elevated as high as it currently is or if she is persistently having a headache she needs to be evaluated in the ER.  She is going to take multiple readings of her blood pressure over the next 24 hours I encouraged her to take it every 2-3 hours while she is awake.  She will follow up with Dr. Hale Bogus office in the morning as well.     Carola Rhine, PAC

## 2019-07-02 ENCOUNTER — Telehealth: Payer: Self-pay | Admitting: Radiation Therapy

## 2019-07-02 NOTE — Telephone Encounter (Signed)
I did not reach [place] on the phone and her voicemail is not set up, so I called her husband. He said that she is doing well. No issues since her [place] treatment yesterday. "She's feeling good". I asked him to have her call me when he gets home. Audrey Patel has an appointment soon with her PCP to evaluate her high blood pressure. Per Dr. Ida Rogue PA, Audrey Patel, Audrey Patel should also discuss when to resume her metformin since her labs from yesterday were still too high.     Audrey Patel was thankful for the call, he does not know when [name] is scheduled to see her PCP, but will have [name] call me when he gets home.   Mont Dutton R.T.(R)(T) Radiation Special Procedures Navigator

## 2019-07-03 ENCOUNTER — Telehealth: Payer: Self-pay | Admitting: Radiation Therapy

## 2019-07-03 NOTE — Telephone Encounter (Addendum)
Audrey Patel had not called back about her Metformin and upcoming PCP appointment, so I called her again. She said that she does not have an appointment with her PCP, but plans to call them this afternoon to set something up ASAP to discuss her high blood pressure and resuming metformin. She has my contact information and will call me back to let us know when her appointment is.   Grandson called back, appointment with PCP has been made for Thursday 12/17 @ 2:30.     Mont Dutton R.T.(R)(T) Radiation Special Procedures Navigator

## 2019-07-10 DIAGNOSIS — Z79899 Other long term (current) drug therapy: Secondary | ICD-10-CM | POA: Diagnosis not present

## 2019-07-10 DIAGNOSIS — Z23 Encounter for immunization: Secondary | ICD-10-CM | POA: Diagnosis not present

## 2019-07-10 DIAGNOSIS — Z713 Dietary counseling and surveillance: Secondary | ICD-10-CM | POA: Diagnosis not present

## 2019-07-10 DIAGNOSIS — I10 Essential (primary) hypertension: Secondary | ICD-10-CM | POA: Diagnosis not present

## 2019-07-16 DIAGNOSIS — E1165 Type 2 diabetes mellitus with hyperglycemia: Secondary | ICD-10-CM | POA: Diagnosis not present

## 2019-07-27 DIAGNOSIS — E1165 Type 2 diabetes mellitus with hyperglycemia: Secondary | ICD-10-CM | POA: Diagnosis not present

## 2019-07-27 DIAGNOSIS — Z9114 Patient's other noncompliance with medication regimen: Secondary | ICD-10-CM | POA: Diagnosis not present

## 2019-07-27 DIAGNOSIS — U071 COVID-19: Secondary | ICD-10-CM | POA: Diagnosis not present

## 2019-07-27 DIAGNOSIS — R531 Weakness: Secondary | ICD-10-CM | POA: Diagnosis not present

## 2019-08-01 ENCOUNTER — Encounter: Payer: Self-pay | Admitting: Radiation Oncology

## 2019-08-04 ENCOUNTER — Other Ambulatory Visit: Payer: Self-pay

## 2019-08-04 ENCOUNTER — Inpatient Hospital Stay
Admission: RE | Admit: 2019-08-04 | Discharge: 2019-08-04 | Disposition: A | Discharge status: Home / Self Care | Payer: Medicare Other | Source: Ambulatory Visit | Attending: Radiation Oncology | Admitting: Radiation Oncology

## 2019-08-04 ENCOUNTER — Telehealth: Payer: Self-pay | Admitting: Radiation Oncology

## 2019-08-04 ENCOUNTER — Ambulatory Visit
Admission: RE | Admit: 2019-08-04 | Discharge: 2019-08-04 | Disposition: A | Discharge status: Home / Self Care | Payer: Medicare Other | Source: Ambulatory Visit | Attending: Radiation Oncology | Admitting: Radiation Oncology

## 2019-08-04 DIAGNOSIS — D320 Benign neoplasm of cerebral meninges: Secondary | ICD-10-CM

## 2019-08-04 DIAGNOSIS — R000 Tachycardia, unspecified: Secondary | ICD-10-CM | POA: Diagnosis not present

## 2019-08-04 DIAGNOSIS — D329 Benign neoplasm of meninges, unspecified: Secondary | ICD-10-CM

## 2019-08-04 DIAGNOSIS — E876 Hypokalemia: Secondary | ICD-10-CM | POA: Diagnosis not present

## 2019-08-04 DIAGNOSIS — I639 Cerebral infarction, unspecified: Secondary | ICD-10-CM | POA: Diagnosis not present

## 2019-08-04 DIAGNOSIS — I161 Hypertensive emergency: Secondary | ICD-10-CM | POA: Diagnosis not present

## 2019-08-04 DIAGNOSIS — R790 Abnormal level of blood mineral: Secondary | ICD-10-CM | POA: Diagnosis not present

## 2019-08-04 DIAGNOSIS — R4182 Altered mental status, unspecified: Secondary | ICD-10-CM | POA: Diagnosis not present

## 2019-08-04 DIAGNOSIS — Z794 Long term (current) use of insulin: Secondary | ICD-10-CM | POA: Diagnosis not present

## 2019-08-04 DIAGNOSIS — U071 COVID-19: Secondary | ICD-10-CM | POA: Diagnosis not present

## 2019-08-04 DIAGNOSIS — E1165 Type 2 diabetes mellitus with hyperglycemia: Secondary | ICD-10-CM | POA: Diagnosis not present

## 2019-08-04 DIAGNOSIS — I619 Nontraumatic intracerebral hemorrhage, unspecified: Secondary | ICD-10-CM | POA: Diagnosis not present

## 2019-08-04 NOTE — Addendum Note (Signed)
Encounter addended by: Consuella Lose, MD on: 08/04/2019 9:20 AM  Actions taken: Clinical Note Signed

## 2019-08-04 NOTE — Telephone Encounter (Signed)
  Radiation Oncology         [phone]) [phone] ________________________________  Name: Audrey Patel MRN: [id number]  Date of Service: 08/04/2019  DOB: 1950-08-20  Post Treatment Telephone Note  Diagnosis:  Meningioma of the CP angle.  Interval Since Last Radiation: 5 weeks   07/01/2019 SRS Treatment:  PTV1 Rt Porous Acusticus 17 mm  Narrative:  The patient was contacted today for routine follow-up. During treatment she did very well with radiotherapy and did not have significant desquamation.She did have some difficulty with hypertension after having had a cardiac study and did not take her antihypertensive medications on the day of her treatment. She was to follow up with her PCP mid December 2020. It is unclear if she did follow up.  Impression/Plan: 1. Meningioma. The patient has been doing well since completion of radiotherapy. I was unable to reach the patient and could not leave a message. However, we will coordinate follow up with our brain oncology navigator and we would be happy to continue to follow her as needed, but given her tumor type, Dr. Mickeal Skinner in neuro oncology would like to see her for surveillance. We will be present in the discussions for her MRI imaging in brain oncology conference as well.      Carola Rhine, PAC

## 2019-08-04 NOTE — Op Note (Signed)
Name: Audrey Patel    MRN: [id number]   Date: 07/01/2019    DOB: 14-Sep-1950   STEREOTACTIC RADIOSURGERY OPERATIVE NOTE  PRE-OPERATIVE DIAGNOSIS:  Right Posterior Fossa Tumor  POST-OPERATIVE DIAGNOSIS:  Same  PROCEDURE:  Stereotactic Radiosurgery  SURGEON:  Consuella Lose, MD  RADIATION ONCOLOGIST: Dr. Kyung Rudd, MD  TECHNIQUE:  The patient underwent a radiation treatment planning session in the radiation oncology simulation suite under the care of the radiation oncology physician and physicist.  I participated closely in the radiation treatment planning afterwards. The patient underwent planning CT which was fused to 3T high resolution MRI with 1 mm axial slices.  These images were fused on the planning system.  We contoured the gross target volumes and subsequently expanded this to yield the Planning Target Volume. I actively participated in the planning process.  I helped to define and review the target contours and also the contours of the optic pathway, eyes, brainstem and selected nearby organs at risk.  All the dose constraints for critical structures were reviewed and compared to AAPM Task Group 101.  The prescription dose conformity was reviewed.  I approved the plan electronically.    Accordingly, Audrey Patel  was brought to the TrueBeam stereotactic radiation treatment linac and placed in the custom immobilization mask.  The patient was aligned according to the IR fiducial markers with BrainLab Exactrac, then orthogonal x-rays were used in ExacTrac with the 6DOF robotic table and the shifts were made to align the patient  Audrey Patel received stereotactic radiosurgery to a prescription dose of 14Gy uneventfully to the right CP angle tumor.    The detailed description of the procedure is recorded in the radiation oncology procedure note.  I was present for the duration of the procedure.  DISPOSITION:   Following delivery, the patient was transported to nursing in stable  condition and monitored for possible acute effects to be discharged to home in stable condition with follow-up in one month.  Consuella Lose, MD [facility]

## 2019-08-05 DIAGNOSIS — J449 Chronic obstructive pulmonary disease, unspecified: Secondary | ICD-10-CM | POA: Diagnosis not present

## 2019-08-05 DIAGNOSIS — R918 Other nonspecific abnormal finding of lung field: Secondary | ICD-10-CM | POA: Diagnosis not present

## 2019-08-05 DIAGNOSIS — I808 Phlebitis and thrombophlebitis of other sites: Secondary | ICD-10-CM | POA: Diagnosis not present

## 2019-08-05 DIAGNOSIS — I69391 Dysphagia following cerebral infarction: Secondary | ICD-10-CM | POA: Diagnosis not present

## 2019-08-05 DIAGNOSIS — E1110 Type 2 diabetes mellitus with ketoacidosis without coma: Secondary | ICD-10-CM | POA: Diagnosis not present

## 2019-08-05 DIAGNOSIS — J440 Chronic obstructive pulmonary disease with acute lower respiratory infection: Secondary | ICD-10-CM | POA: Diagnosis not present

## 2019-08-05 DIAGNOSIS — D329 Benign neoplasm of meninges, unspecified: Secondary | ICD-10-CM | POA: Diagnosis not present

## 2019-08-05 DIAGNOSIS — R1319 Other dysphagia: Secondary | ICD-10-CM | POA: Diagnosis not present

## 2019-08-05 DIAGNOSIS — R279 Unspecified lack of coordination: Secondary | ICD-10-CM | POA: Diagnosis not present

## 2019-08-05 DIAGNOSIS — R109 Unspecified abdominal pain: Secondary | ICD-10-CM | POA: Diagnosis not present

## 2019-08-05 DIAGNOSIS — R000 Tachycardia, unspecified: Secondary | ICD-10-CM | POA: Diagnosis not present

## 2019-08-05 DIAGNOSIS — Z8616 Personal history of COVID-19: Secondary | ICD-10-CM | POA: Diagnosis not present

## 2019-08-05 DIAGNOSIS — Z794 Long term (current) use of insulin: Secondary | ICD-10-CM | POA: Diagnosis not present

## 2019-08-05 DIAGNOSIS — N179 Acute kidney failure, unspecified: Secondary | ICD-10-CM | POA: Diagnosis not present

## 2019-08-05 DIAGNOSIS — E870 Hyperosmolality and hypernatremia: Secondary | ICD-10-CM | POA: Diagnosis not present

## 2019-08-05 DIAGNOSIS — R4182 Altered mental status, unspecified: Secondary | ICD-10-CM | POA: Diagnosis not present

## 2019-08-05 DIAGNOSIS — R531 Weakness: Secondary | ICD-10-CM | POA: Diagnosis not present

## 2019-08-05 DIAGNOSIS — I639 Cerebral infarction, unspecified: Secondary | ICD-10-CM | POA: Diagnosis not present

## 2019-08-05 DIAGNOSIS — R790 Abnormal level of blood mineral: Secondary | ICD-10-CM | POA: Diagnosis not present

## 2019-08-05 DIAGNOSIS — E119 Type 2 diabetes mellitus without complications: Secondary | ICD-10-CM | POA: Diagnosis not present

## 2019-08-05 DIAGNOSIS — I161 Hypertensive emergency: Secondary | ICD-10-CM | POA: Diagnosis not present

## 2019-08-05 DIAGNOSIS — R7989 Other specified abnormal findings of blood chemistry: Secondary | ICD-10-CM | POA: Diagnosis not present

## 2019-08-05 DIAGNOSIS — R739 Hyperglycemia, unspecified: Secondary | ICD-10-CM | POA: Diagnosis not present

## 2019-08-05 DIAGNOSIS — I110 Hypertensive heart disease with heart failure: Secondary | ICD-10-CM | POA: Diagnosis not present

## 2019-08-05 DIAGNOSIS — J1282 Pneumonia due to coronavirus disease 2019: Secondary | ICD-10-CM | POA: Diagnosis not present

## 2019-08-05 DIAGNOSIS — R9431 Abnormal electrocardiogram [ECG] [EKG]: Secondary | ICD-10-CM | POA: Diagnosis not present

## 2019-08-05 DIAGNOSIS — U071 COVID-19: Secondary | ICD-10-CM | POA: Diagnosis not present

## 2019-08-05 DIAGNOSIS — R0689 Other abnormalities of breathing: Secondary | ICD-10-CM | POA: Diagnosis not present

## 2019-08-05 DIAGNOSIS — E1165 Type 2 diabetes mellitus with hyperglycemia: Secondary | ICD-10-CM | POA: Diagnosis not present

## 2019-08-05 DIAGNOSIS — I5022 Chronic systolic (congestive) heart failure: Secondary | ICD-10-CM | POA: Diagnosis not present

## 2019-08-05 DIAGNOSIS — K219 Gastro-esophageal reflux disease without esophagitis: Secondary | ICD-10-CM | POA: Diagnosis not present

## 2019-08-05 DIAGNOSIS — I160 Hypertensive urgency: Secondary | ICD-10-CM | POA: Diagnosis not present

## 2019-08-05 DIAGNOSIS — Z4659 Encounter for fitting and adjustment of other gastrointestinal appliance and device: Secondary | ICD-10-CM | POA: Diagnosis not present

## 2019-08-05 DIAGNOSIS — E1169 Type 2 diabetes mellitus with other specified complication: Secondary | ICD-10-CM | POA: Diagnosis not present

## 2019-08-05 DIAGNOSIS — I517 Cardiomegaly: Secondary | ICD-10-CM | POA: Diagnosis not present

## 2019-08-05 DIAGNOSIS — R58 Hemorrhage, not elsewhere classified: Secondary | ICD-10-CM | POA: Diagnosis not present

## 2019-08-05 DIAGNOSIS — I5020 Unspecified systolic (congestive) heart failure: Secondary | ICD-10-CM | POA: Diagnosis not present

## 2019-08-05 DIAGNOSIS — I6389 Other cerebral infarction: Secondary | ICD-10-CM | POA: Diagnosis not present

## 2019-08-05 DIAGNOSIS — R633 Feeding difficulties: Secondary | ICD-10-CM | POA: Diagnosis not present

## 2019-08-05 DIAGNOSIS — Z20822 Contact with and (suspected) exposure to covid-19: Secondary | ICD-10-CM | POA: Diagnosis not present

## 2019-08-05 DIAGNOSIS — R1312 Dysphagia, oropharyngeal phase: Secondary | ICD-10-CM | POA: Diagnosis not present

## 2019-08-05 DIAGNOSIS — J189 Pneumonia, unspecified organism: Secondary | ICD-10-CM | POA: Diagnosis not present

## 2019-08-05 DIAGNOSIS — R4781 Slurred speech: Secondary | ICD-10-CM | POA: Diagnosis not present

## 2019-08-05 DIAGNOSIS — R410 Disorientation, unspecified: Secondary | ICD-10-CM | POA: Diagnosis not present

## 2019-08-05 DIAGNOSIS — R55 Syncope and collapse: Secondary | ICD-10-CM | POA: Diagnosis not present

## 2019-08-05 DIAGNOSIS — E876 Hypokalemia: Secondary | ICD-10-CM | POA: Diagnosis not present

## 2019-08-05 DIAGNOSIS — Z4682 Encounter for fitting and adjustment of non-vascular catheter: Secondary | ICD-10-CM | POA: Diagnosis not present

## 2019-08-05 DIAGNOSIS — I10 Essential (primary) hypertension: Secondary | ICD-10-CM | POA: Diagnosis not present

## 2019-08-05 DIAGNOSIS — R1310 Dysphagia, unspecified: Secondary | ICD-10-CM | POA: Diagnosis not present

## 2019-08-05 DIAGNOSIS — G4089 Other seizures: Secondary | ICD-10-CM | POA: Diagnosis not present

## 2019-08-05 DIAGNOSIS — E860 Dehydration: Secondary | ICD-10-CM | POA: Diagnosis not present

## 2019-08-05 DIAGNOSIS — E871 Hypo-osmolality and hyponatremia: Secondary | ICD-10-CM | POA: Diagnosis not present

## 2019-08-05 DIAGNOSIS — R509 Fever, unspecified: Secondary | ICD-10-CM | POA: Diagnosis not present

## 2019-08-05 DIAGNOSIS — I619 Nontraumatic intracerebral hemorrhage, unspecified: Secondary | ICD-10-CM | POA: Diagnosis not present

## 2019-08-05 DIAGNOSIS — G936 Cerebral edema: Secondary | ICD-10-CM | POA: Diagnosis not present

## 2019-08-05 DIAGNOSIS — I4811 Longstanding persistent atrial fibrillation: Secondary | ICD-10-CM | POA: Diagnosis not present

## 2019-08-05 DIAGNOSIS — D72829 Elevated white blood cell count, unspecified: Secondary | ICD-10-CM | POA: Diagnosis not present

## 2019-08-05 DIAGNOSIS — I2510 Atherosclerotic heart disease of native coronary artery without angina pectoris: Secondary | ICD-10-CM | POA: Diagnosis not present

## 2019-08-05 DIAGNOSIS — I614 Nontraumatic intracerebral hemorrhage in cerebellum: Secondary | ICD-10-CM | POA: Diagnosis not present

## 2019-08-05 DIAGNOSIS — Z8673 Personal history of transient ischemic attack (TIA), and cerebral infarction without residual deficits: Secondary | ICD-10-CM | POA: Diagnosis not present

## 2019-08-05 DIAGNOSIS — Z8669 Personal history of other diseases of the nervous system and sense organs: Secondary | ICD-10-CM | POA: Diagnosis not present

## 2019-08-08 MED ORDER — HEPARIN SODIUM (PORCINE) 5000 UNIT/ML IJ SOLN
5000.00 | INTRAMUSCULAR | Status: DC
Start: 2019-08-21 — End: 2019-08-08

## 2019-08-08 MED ORDER — PROCHLORPERAZINE EDISYLATE 10 MG/2ML IJ SOLN
5.00 | INTRAMUSCULAR | Status: DC
Start: ? — End: 2019-08-08

## 2019-08-08 MED ORDER — LABETALOL HCL 5 MG/ML IV SOLN
10.00 | INTRAVENOUS | Status: DC
Start: ? — End: 2019-08-08

## 2019-08-08 MED ORDER — GLUCOSE 40 % PO GEL
15.00 | ORAL | Status: DC
Start: ? — End: 2019-08-08

## 2019-08-08 MED ORDER — LEVETIRACETAM 100 MG/ML PO SOLN
1000.00 | ORAL | Status: DC
Start: 2019-08-15 — End: 2019-08-08

## 2019-08-08 MED ORDER — BUDESONIDE-FORMOTEROL FUMARATE 80-4.5 MCG/ACT IN AERO
2.00 | INHALATION_SPRAY | RESPIRATORY_TRACT | Status: DC
Start: 2019-08-08 — End: 2019-08-08

## 2019-08-08 MED ORDER — VALPROIC ACID 250 MG/5ML PO SOLN
250.00 | ORAL | Status: DC
Start: 2019-08-15 — End: 2019-08-08

## 2019-08-08 MED ORDER — ALBUTEROL SULFATE HFA 108 (90 BASE) MCG/ACT IN AERS
2.00 | INHALATION_SPRAY | RESPIRATORY_TRACT | Status: DC
Start: ? — End: 2019-08-08

## 2019-08-08 MED ORDER — ONDANSETRON HCL 4 MG/2ML IJ SOLN
4.00 | INTRAMUSCULAR | Status: DC
Start: ? — End: 2019-08-08

## 2019-08-08 MED ORDER — GENERIC EXTERNAL MEDICATION
1.00 | Status: DC
Start: 2019-08-09 — End: 2019-08-08

## 2019-08-08 MED ORDER — GENERIC EXTERNAL MEDICATION
Status: DC
Start: ? — End: 2019-08-08

## 2019-08-08 MED ORDER — MUPIROCIN 2 % EX OINT
TOPICAL_OINTMENT | CUTANEOUS | Status: DC
Start: 2019-08-08 — End: 2019-08-08

## 2019-08-08 MED ORDER — ROSUVASTATIN CALCIUM 20 MG PO TABS
40.00 | ORAL_TABLET | ORAL | Status: DC
Start: 2019-08-15 — End: 2019-08-08

## 2019-08-08 MED ORDER — ACETAMINOPHEN 325 MG PO TABS
650.00 | ORAL_TABLET | ORAL | Status: DC
Start: ? — End: 2019-08-08

## 2019-08-08 MED ORDER — INSULIN GLARGINE 100 UNIT/ML SOLOSTAR PEN
45.00 | PEN_INJECTOR | SUBCUTANEOUS | Status: DC
Start: 2019-08-08 — End: 2019-08-08

## 2019-08-08 MED ORDER — LISINOPRIL 5 MG PO TABS
5.00 | ORAL_TABLET | ORAL | Status: DC
Start: 2019-08-16 — End: 2019-08-08

## 2019-08-08 MED ORDER — GENERIC EXTERNAL MEDICATION
40.00 | Status: DC
Start: 2019-08-15 — End: 2019-08-08

## 2019-08-08 MED ORDER — PLASMA-LYTE A IV SOLN
INTRAVENOUS | Status: DC
Start: ? — End: 2019-08-08

## 2019-08-08 MED ORDER — HYDRALAZINE HCL 20 MG/ML IJ SOLN
10.00 | INTRAMUSCULAR | Status: DC
Start: ? — End: 2019-08-08

## 2019-08-08 MED ORDER — INSULIN LISPRO 100 UNIT/ML ~~LOC~~ SOLN
3.00 | SUBCUTANEOUS | Status: DC
Start: 2019-08-08 — End: 2019-08-08

## 2019-08-08 MED ORDER — AMLODIPINE BESYLATE 5 MG PO TABS
10.00 | ORAL_TABLET | ORAL | Status: DC
Start: 2019-08-16 — End: 2019-08-08

## 2019-08-08 MED ORDER — CARVEDILOL 12.5 MG PO TABS
12.50 | ORAL_TABLET | ORAL | Status: DC
Start: 2019-08-15 — End: 2019-08-08

## 2019-08-08 MED ORDER — DEXTROSE 10 % IV SOLN
125.00 | INTRAVENOUS | Status: DC
Start: ? — End: 2019-08-08

## 2019-08-15 MED ORDER — MELATONIN 3 MG PO TABS
6.00 | ORAL_TABLET | ORAL | Status: DC
Start: 2019-08-15 — End: 2019-08-15

## 2019-08-15 MED ORDER — SERTRALINE HCL 100 MG PO TABS
100.00 | ORAL_TABLET | ORAL | Status: DC
Start: 2019-08-16 — End: 2019-08-15

## 2019-08-15 MED ORDER — INSULIN LISPRO 100 UNIT/ML ~~LOC~~ SOLN
6.00 | SUBCUTANEOUS | Status: DC
Start: 2019-08-21 — End: 2019-08-15

## 2019-08-15 MED ORDER — BUDESONIDE-FORMOTEROL FUMARATE 80-4.5 MCG/ACT IN AERO
2.00 | INHALATION_SPRAY | RESPIRATORY_TRACT | Status: DC
Start: 2019-08-21 — End: 2019-08-15

## 2019-08-15 MED ORDER — INSULIN LISPRO 100 UNIT/ML ~~LOC~~ SOLN
2.00 | SUBCUTANEOUS | Status: DC
Start: 2019-08-21 — End: 2019-08-15

## 2019-08-15 MED ORDER — ALBUTEROL SULFATE HFA 108 (90 BASE) MCG/ACT IN AERS
2.00 | INHALATION_SPRAY | RESPIRATORY_TRACT | Status: DC
Start: ? — End: 2019-08-15

## 2019-08-15 MED ORDER — ZONISAMIDE 100 MG PO CAPS
100.00 | ORAL_CAPSULE | ORAL | Status: DC
Start: 2019-08-15 — End: 2019-08-15

## 2019-08-15 MED ORDER — INSULIN GLARGINE 100 UNIT/ML SOLOSTAR PEN
40.00 | PEN_INJECTOR | SUBCUTANEOUS | Status: DC
Start: 2019-08-15 — End: 2019-08-15

## 2019-08-21 DIAGNOSIS — D329 Benign neoplasm of meninges, unspecified: Secondary | ICD-10-CM | POA: Diagnosis not present

## 2019-08-21 DIAGNOSIS — U071 COVID-19: Secondary | ICD-10-CM | POA: Diagnosis not present

## 2019-08-21 DIAGNOSIS — R0689 Other abnormalities of breathing: Secondary | ICD-10-CM | POA: Diagnosis not present

## 2019-08-21 DIAGNOSIS — R404 Transient alteration of awareness: Secondary | ICD-10-CM | POA: Diagnosis not present

## 2019-08-21 DIAGNOSIS — J80 Acute respiratory distress syndrome: Secondary | ICD-10-CM | POA: Diagnosis not present

## 2019-08-21 DIAGNOSIS — Z4682 Encounter for fitting and adjustment of non-vascular catheter: Secondary | ICD-10-CM | POA: Diagnosis not present

## 2019-08-21 DIAGNOSIS — I619 Nontraumatic intracerebral hemorrhage, unspecified: Secondary | ICD-10-CM | POA: Diagnosis not present

## 2019-08-21 DIAGNOSIS — I10 Essential (primary) hypertension: Secondary | ICD-10-CM | POA: Diagnosis not present

## 2019-08-21 DIAGNOSIS — R0989 Other specified symptoms and signs involving the circulatory and respiratory systems: Secondary | ICD-10-CM | POA: Diagnosis not present

## 2019-08-21 DIAGNOSIS — Z7401 Bed confinement status: Secondary | ICD-10-CM | POA: Diagnosis not present

## 2019-08-21 DIAGNOSIS — M2550 Pain in unspecified joint: Secondary | ICD-10-CM | POA: Diagnosis not present

## 2019-08-21 DIAGNOSIS — F17220 Nicotine dependence, chewing tobacco, uncomplicated: Secondary | ICD-10-CM | POA: Diagnosis not present

## 2019-08-21 DIAGNOSIS — R509 Fever, unspecified: Secondary | ICD-10-CM | POA: Diagnosis not present

## 2019-08-21 DIAGNOSIS — K9420 Gastrostomy complication, unspecified: Secondary | ICD-10-CM | POA: Diagnosis not present

## 2019-08-21 DIAGNOSIS — R0902 Hypoxemia: Secondary | ICD-10-CM | POA: Diagnosis not present

## 2019-08-21 DIAGNOSIS — J449 Chronic obstructive pulmonary disease, unspecified: Secondary | ICD-10-CM | POA: Diagnosis not present

## 2019-08-21 DIAGNOSIS — Z431 Encounter for attention to gastrostomy: Secondary | ICD-10-CM | POA: Diagnosis not present

## 2019-08-21 DIAGNOSIS — I4811 Longstanding persistent atrial fibrillation: Secondary | ICD-10-CM | POA: Diagnosis not present

## 2019-08-21 DIAGNOSIS — R0602 Shortness of breath: Secondary | ICD-10-CM | POA: Diagnosis not present

## 2019-08-21 DIAGNOSIS — R279 Unspecified lack of coordination: Secondary | ICD-10-CM | POA: Diagnosis not present

## 2019-08-21 DIAGNOSIS — G4089 Other seizures: Secondary | ICD-10-CM | POA: Diagnosis not present

## 2019-08-21 DIAGNOSIS — I499 Cardiac arrhythmia, unspecified: Secondary | ICD-10-CM | POA: Diagnosis not present

## 2019-08-21 DIAGNOSIS — G459 Transient cerebral ischemic attack, unspecified: Secondary | ICD-10-CM | POA: Diagnosis not present

## 2019-08-21 DIAGNOSIS — E1169 Type 2 diabetes mellitus with other specified complication: Secondary | ICD-10-CM | POA: Diagnosis not present

## 2019-08-21 DIAGNOSIS — I2510 Atherosclerotic heart disease of native coronary artery without angina pectoris: Secondary | ICD-10-CM | POA: Diagnosis not present

## 2019-08-21 DIAGNOSIS — R531 Weakness: Secondary | ICD-10-CM | POA: Diagnosis not present

## 2019-08-21 DIAGNOSIS — D509 Iron deficiency anemia, unspecified: Secondary | ICD-10-CM | POA: Diagnosis not present

## 2019-08-21 DIAGNOSIS — R410 Disorientation, unspecified: Secondary | ICD-10-CM | POA: Diagnosis not present

## 2019-08-21 DIAGNOSIS — R109 Unspecified abdominal pain: Secondary | ICD-10-CM | POA: Diagnosis not present

## 2019-08-21 DIAGNOSIS — Z743 Need for continuous supervision: Secondary | ICD-10-CM | POA: Diagnosis not present

## 2019-08-21 DIAGNOSIS — I614 Nontraumatic intracerebral hemorrhage in cerebellum: Secondary | ICD-10-CM | POA: Diagnosis not present

## 2019-08-21 DIAGNOSIS — R58 Hemorrhage, not elsewhere classified: Secondary | ICD-10-CM | POA: Diagnosis not present

## 2019-08-21 DIAGNOSIS — E119 Type 2 diabetes mellitus without complications: Secondary | ICD-10-CM | POA: Diagnosis not present

## 2019-08-21 DIAGNOSIS — I69391 Dysphagia following cerebral infarction: Secondary | ICD-10-CM | POA: Diagnosis not present

## 2019-08-21 DIAGNOSIS — K219 Gastro-esophageal reflux disease without esophagitis: Secondary | ICD-10-CM | POA: Diagnosis not present

## 2019-08-21 DIAGNOSIS — K9423 Gastrostomy malfunction: Secondary | ICD-10-CM | POA: Diagnosis not present

## 2019-08-21 DIAGNOSIS — K9429 Other complications of gastrostomy: Secondary | ICD-10-CM | POA: Diagnosis not present

## 2019-08-21 DIAGNOSIS — I161 Hypertensive emergency: Secondary | ICD-10-CM | POA: Diagnosis not present

## 2019-08-22 DIAGNOSIS — I10 Essential (primary) hypertension: Secondary | ICD-10-CM | POA: Diagnosis not present

## 2019-08-22 DIAGNOSIS — K9423 Gastrostomy malfunction: Secondary | ICD-10-CM | POA: Diagnosis not present

## 2019-08-22 DIAGNOSIS — U071 COVID-19: Secondary | ICD-10-CM | POA: Diagnosis not present

## 2019-08-22 DIAGNOSIS — F17220 Nicotine dependence, chewing tobacco, uncomplicated: Secondary | ICD-10-CM | POA: Diagnosis not present

## 2019-08-22 DIAGNOSIS — E119 Type 2 diabetes mellitus without complications: Secondary | ICD-10-CM | POA: Diagnosis not present

## 2019-08-22 DIAGNOSIS — K9429 Other complications of gastrostomy: Secondary | ICD-10-CM | POA: Diagnosis not present

## 2019-08-22 MED ORDER — LIDOCAINE 4 % EX PTCH
1.00 | MEDICATED_PATCH | CUTANEOUS | Status: DC
Start: 2019-08-22 — End: 2019-08-22

## 2019-08-22 MED ORDER — AMLODIPINE BESYLATE 5 MG PO TABS
10.00 | ORAL_TABLET | ORAL | Status: DC
Start: 2019-08-22 — End: 2019-08-22

## 2019-08-22 MED ORDER — POLYETHYLENE GLYCOL 3350 17 GM/SCOOP PO POWD
17.00 | ORAL | Status: DC
Start: 2019-08-22 — End: 2019-08-22

## 2019-08-22 MED ORDER — GENERIC EXTERNAL MEDICATION
40.00 | Status: DC
Start: 2019-08-21 — End: 2019-08-22

## 2019-08-22 MED ORDER — SERTRALINE HCL 50 MG PO TABS
100.00 | ORAL_TABLET | ORAL | Status: DC
Start: 2019-08-22 — End: 2019-08-22

## 2019-08-22 MED ORDER — LEVETIRACETAM 100 MG/ML PO SOLN
1000.00 | ORAL | Status: DC
Start: 2019-08-21 — End: 2019-08-22

## 2019-08-22 MED ORDER — ROSUVASTATIN CALCIUM 20 MG PO TABS
40.00 | ORAL_TABLET | ORAL | Status: DC
Start: 2019-08-21 — End: 2019-08-22

## 2019-08-22 MED ORDER — INSULIN GLARGINE 100 UNIT/ML SOLOSTAR PEN
20.00 | PEN_INJECTOR | SUBCUTANEOUS | Status: DC
Start: 2019-08-21 — End: 2019-08-22

## 2019-08-22 MED ORDER — LISINOPRIL 20 MG PO TABS
20.00 | ORAL_TABLET | ORAL | Status: DC
Start: 2019-08-22 — End: 2019-08-22

## 2019-08-22 MED ORDER — MELATONIN 3 MG PO TABS
6.00 | ORAL_TABLET | ORAL | Status: DC
Start: 2019-08-21 — End: 2019-08-22

## 2019-08-22 MED ORDER — VALPROIC ACID 250 MG/5ML PO SOLN
250.00 | ORAL | Status: DC
Start: 2019-08-21 — End: 2019-08-22

## 2019-08-22 MED ORDER — GENERIC EXTERNAL MEDICATION
Status: DC
Start: ? — End: 2019-08-22

## 2019-08-22 MED ORDER — ZONISAMIDE 100 MG PO CAPS
100.00 | ORAL_CAPSULE | ORAL | Status: DC
Start: 2019-08-21 — End: 2019-08-22

## 2019-08-22 MED ORDER — CARVEDILOL 12.5 MG PO TABS
12.50 | ORAL_TABLET | ORAL | Status: DC
Start: 2019-08-21 — End: 2019-08-22

## 2019-08-22 MED ORDER — ACETAMINOPHEN 325 MG PO TABS
650.00 | ORAL_TABLET | ORAL | Status: DC
Start: ? — End: 2019-08-22

## 2019-08-22 MED ORDER — ASPIRIN 81 MG PO TBEC
81.00 | DELAYED_RELEASE_TABLET | ORAL | Status: DC
Start: 2019-08-22 — End: 2019-08-22

## 2019-08-25 DIAGNOSIS — 419620001 Death: Secondary | SNOMED CT | POA: Diagnosis not present

## 2019-08-25 DEATH — deceased

## 2019-08-26 DIAGNOSIS — K9423 Gastrostomy malfunction: Secondary | ICD-10-CM | POA: Diagnosis not present

## 2019-08-26 DIAGNOSIS — Z431 Encounter for attention to gastrostomy: Secondary | ICD-10-CM | POA: Diagnosis not present

## 2019-08-26 DIAGNOSIS — R109 Unspecified abdominal pain: Secondary | ICD-10-CM | POA: Diagnosis not present

## 2019-09-05 ENCOUNTER — Encounter: Payer: Self-pay | Admitting: Radiation Therapy

## 2019-09-10 NOTE — Progress Notes (Signed)
  Radiation Oncology         (336) [phone] ________________________________  Name: Audrey Patel MRN: [id number]  Date: 08/01/2019  DOB: 12-Mar-1951  End of Treatment Note  Diagnosis:   CP angle meningioma     Indication for treatment:  palliative       Radiation treatment dates:   07/01/19  Site/dose:    PTV1 target was treated using 4 Arcs to a prescription dose of 14 Gy. ExacTrac Snap verification was performed for each couch angle.   Narrative: The patient tolerated radiation treatment well.   There were no signs of acute toxicity after treatment.  Plan: The patient has completed radiation treatment. The patient will return to radiation oncology clinic for routine followup in one month. I advised the patient to call or return sooner if they have any questions or concerns related to their recovery or treatment. ________________________________  ------------------------------------------------  Jodelle Gross, MD, PhD

## 2019-09-22 DEATH — deceased

## 2021-02-01 IMAGING — MR MR HEAD WO/W CM
7 series · 48 of 48 positions shown · IV contrast (17 ml multihance)
Comparison: [REDACTED] [HOSPITAL] [HOSPITAL]
brain MRI 05/28/2019 and earlier.

CLINICAL DATA: 68-year-old female with history of right
cerebellopontine angle mass favored to be meningioma. Subsequent
encounter.

EXAM:
MRI HEAD WITHOUT AND WITH CONTRAST
TECHNIQUE: Multiplanar, multiecho pulse sequences of the brain and surrounding
structures were obtained without and with intravenous contrast.
CONTRAST:  17mL MULTIHANCE GADOBENATE DIMEGLUMINE 529 MG/ML IV SOLN

[Series 3: FLAIR · sagittal · 3.0mm · 0.75mm/px · 4 of 39 slices shown (1 of 2)]
[im 1/39]
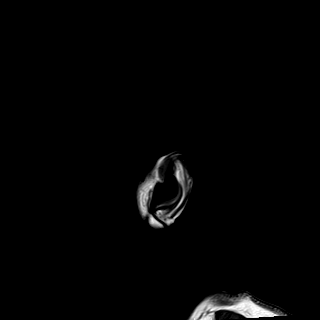
[im 13/39]
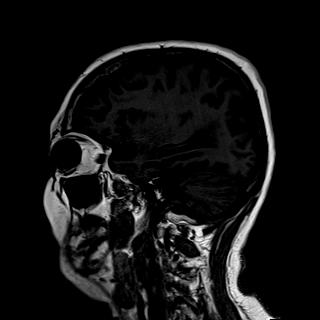
[im 26/39]
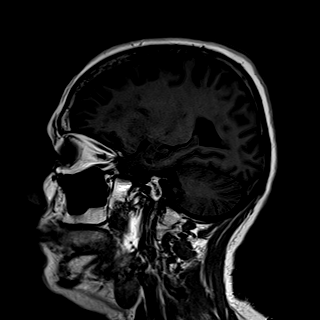
[im 39/39]
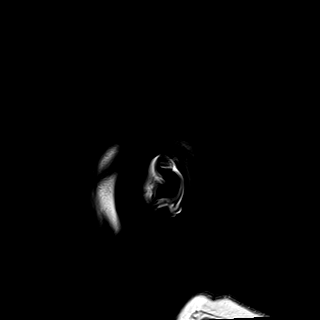

[Series 4: DWI · axial · 3.0mm · 1.50mm/px · z∈[-87,+60]mm · 7 of 78 slices shown]
[im 1/78]
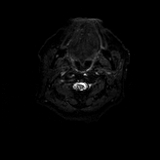
[im 13/78]
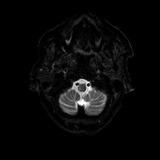
[im 26/78]
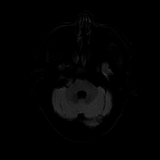
[im 39/78]
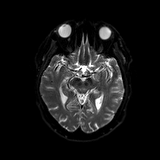
[im 52/78]
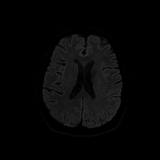
[im 65/78]
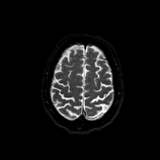
[im 78/78]
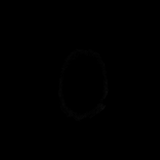

[Series 8: swi_images · axial · 1.5mm · 0.90mm/px · z∈[-81,+61]mm · 9 of 96 slices shown]
[im 1/96]
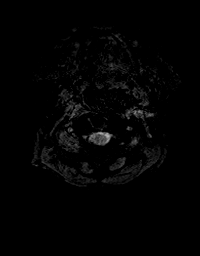
[im 12/96]
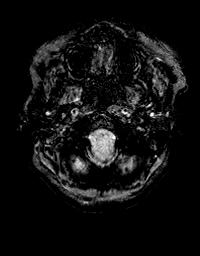
[im 24/96]
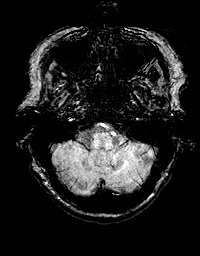
[im 36/96]
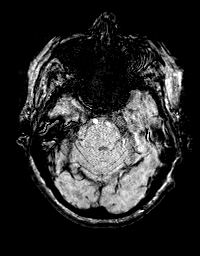
[im 48/96]
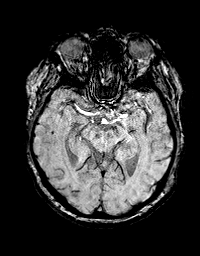
[im 60/96]
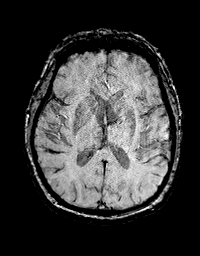
[im 72/96]
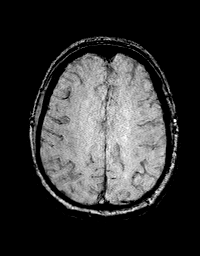
[im 84/96]
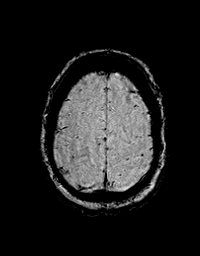
[im 96/96]
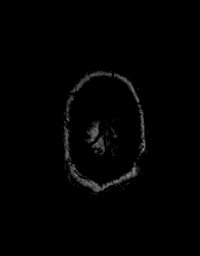

[Series 9: FLAIR · axial · 3.0mm · 0.57mm/px · z∈[-75,+78]mm · 5 of 52 slices shown (2 of 2)]
[im 1/52]
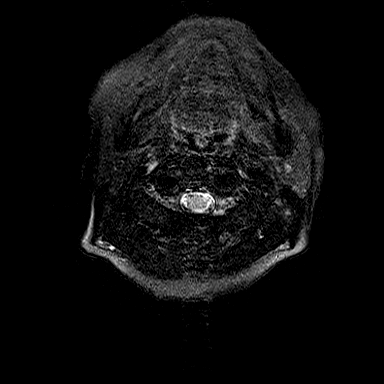
[im 13/52]
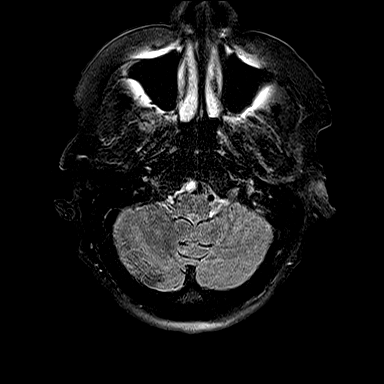
[im 26/52]
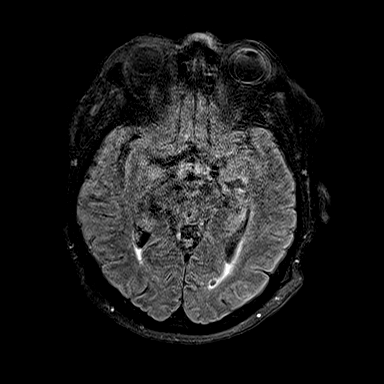
[im 39/52]
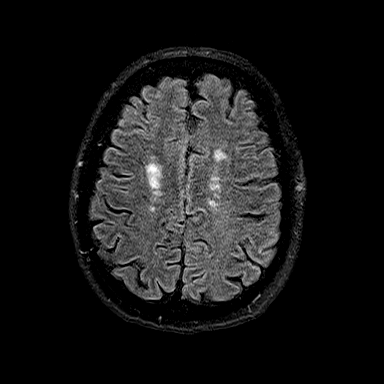
[im 52/52]
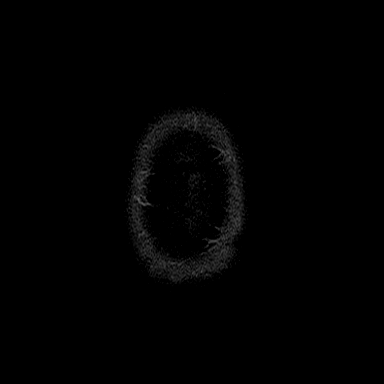

[Series 12: T2 post-contrast · coronal · 3.0mm · 0.57mm/px · 4 of 47 slices shown]
[im 1/47]
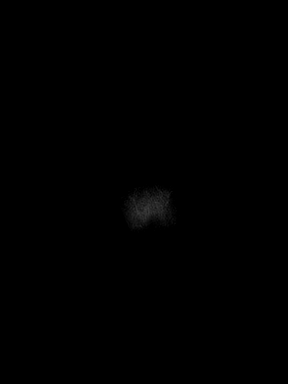
[im 16/47]
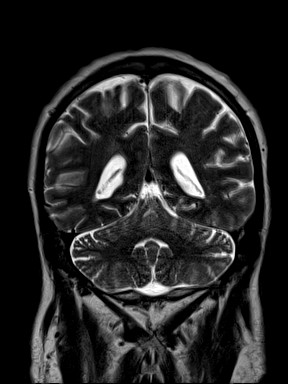
[im 31/47]
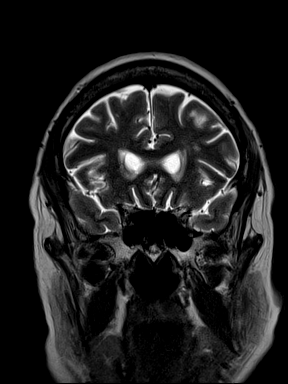
[im 47/47]
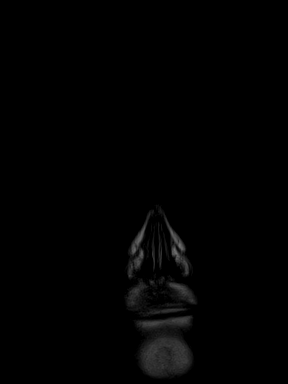

[Series 13: T1 post-contrast · axial · 1.0mm · 0.75mm/px · z∈[-78,+81]mm · 15 of 160 slices shown (1 of 2)]
[im 1/160]
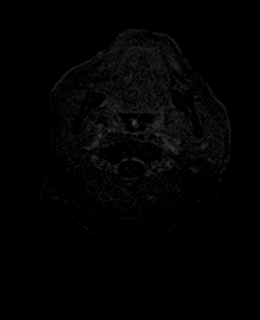
[im 12/160]
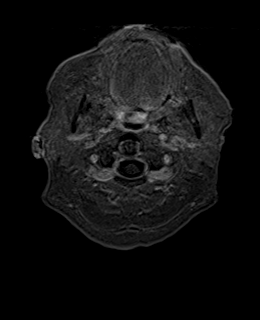
[im 23/160]
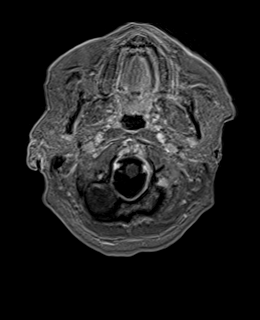
[im 35/160]
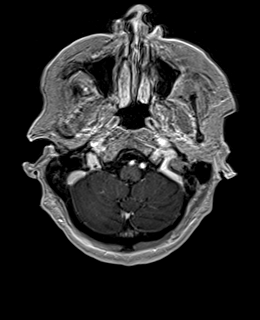
[im 46/160]
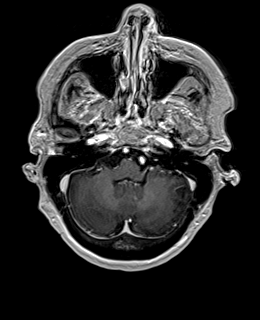
[im 57/160]
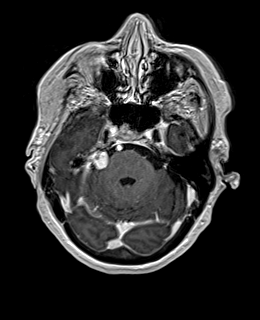
[im 69/160]
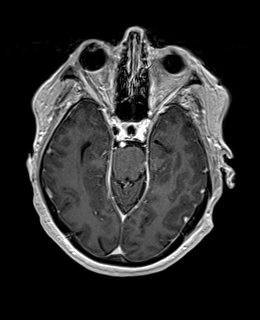
[im 80/160]
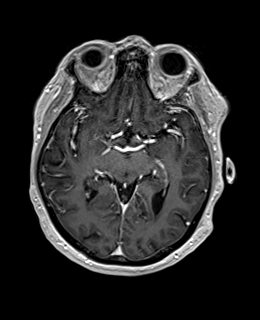
[im 91/160]
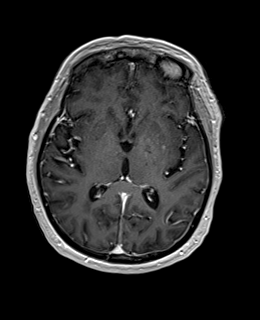
[im 103/160]
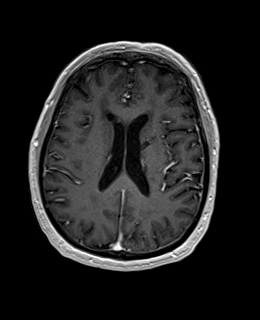
[im 114/160]
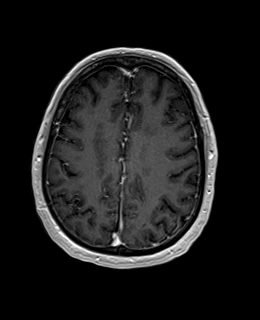
[im 125/160]
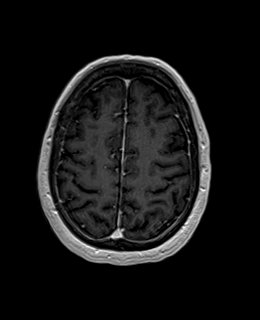
[im 137/160]
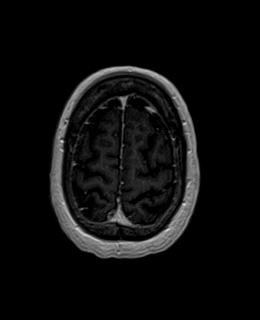
[im 148/160]
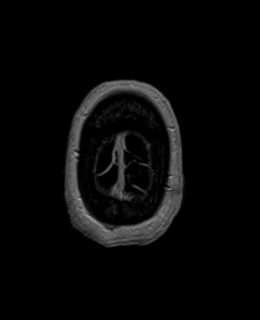
[im 160/160]
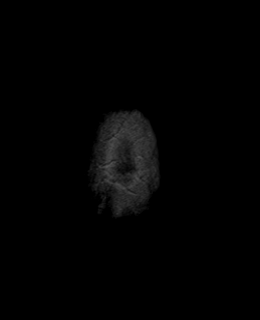

[Series 14: T1 post-contrast · coronal · 3.0mm · 0.57mm/px · 4 of 47 slices shown (2 of 2)]
[im 1/47]
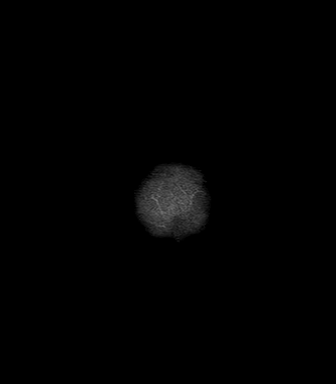
[im 16/47]
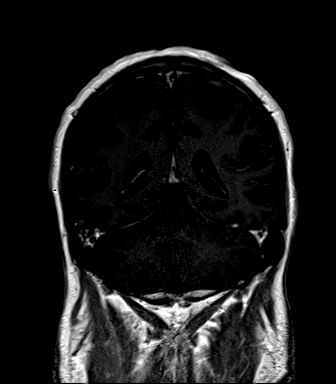
[im 31/47]
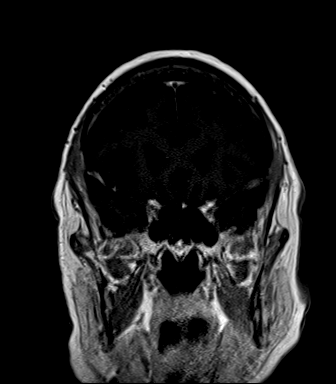
[im 47/47]
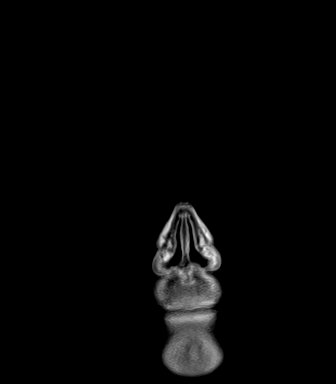

[48 of 48 positions shown; findings below may reference images not displayed]

FINDINGS: Brain: Chronic extra-axial homogeneously enhancing oval and mildly
lobulated mass overlying the right porus acusticus encompasses 15 x
11 x 17 millimeters (AP by transverse by CC). This has been present
since at least 4097 and has slowly enlarged. Size is increased by
about 2 millimeters since November 2017. As before, no evidence of
extension into the right IAC. Possible small dural tail depicted on
series 14, image 24 today. Minimal mass effect on the brainstem with
no brainstem edema.

There is also a punctate area of enhancement in the central right
cerebellum on series 13, image 36. This was subtle on the MRI
earlier this month, not identified in 4591. No associated T2 or
FLAIR hyperintensity, but there does appear to be chronic
microhemorrhage at this site on series 8, image 20 which is also new
from 4591.

No other abnormal intracranial enhancement or dural thickening
identified.

No restricted diffusion or evidence of acute infarction. Chronic
lacunar infarcts in the left corona radiata, bilateral deep gray
nuclei. Additional patchy and scattered bilateral cerebral white
matter T2 and FLAIR hyperintensity. No cortical encephalomalacia.
But there are additional chronic micro hemorrhages including in the
right temporal lobe (series 8, image 49). No midline shift,
ventriculomegaly, or acute intracranial hemorrhage. Cervicomedullary
junction and pituitary are within normal limits.

Vascular: Major intracranial vascular flow voids are stable,
generalized intracranial artery ectasia. The major dural venous
sinuses are enhancing and appear to be patent.

Skull and upper cervical spine: Negative visible cervical spine and
spinal cord. Visualized bone marrow signal is within normal limits.

Sinuses/Orbits: Negative orbits. Paranasal Visualized paranasal
sinuses and mastoids are stable and well pneumatized.

Other: Left internal auditory structures appear normal. Stylomastoid
foramina appear normal. Negative scalp and face soft tissues.
IMPRESSION: 1. Chronic right CP angle mass overlying the right porus acusticus
favored to be meningioma. Stable from the recent 05/28/2019 MRI,
slowly enlarging since first noted in 4097. No extension into the
right IAC. Minimal mass effect on the brainstem with no brainstem
edema.
2. Superimposed chronic small vessel ischemia. A punctate
micro-hemorrhage in the right cerebellum is new since 4591 and
associated with mild enhancement. Favor this is post ischemic, but
attention is directed on follow-up.
3. Chronic intracranial artery ectasia.
